# Patient Record
Sex: Female | Born: 1970 | Race: White | Hispanic: No | Marital: Married | Smoking: Never smoker
Health system: Southern US, Community
[De-identification: ages and names within clinical notes are randomized; demographics above are authoritative.]

## PROBLEM LIST (undated history)

## (undated) DIAGNOSIS — R112 Nausea with vomiting, unspecified: Secondary | ICD-10-CM

## (undated) DIAGNOSIS — J069 Acute upper respiratory infection, unspecified: Secondary | ICD-10-CM

## (undated) DIAGNOSIS — K219 Gastro-esophageal reflux disease without esophagitis: Secondary | ICD-10-CM

## (undated) DIAGNOSIS — E876 Hypokalemia: Secondary | ICD-10-CM

## (undated) DIAGNOSIS — I471 Supraventricular tachycardia, unspecified: Secondary | ICD-10-CM

## (undated) DIAGNOSIS — J42 Unspecified chronic bronchitis: Secondary | ICD-10-CM

## (undated) DIAGNOSIS — Z9889 Other specified postprocedural states: Secondary | ICD-10-CM

## (undated) DIAGNOSIS — Z8489 Family history of other specified conditions: Secondary | ICD-10-CM

## (undated) DIAGNOSIS — K589 Irritable bowel syndrome without diarrhea: Secondary | ICD-10-CM

## (undated) DIAGNOSIS — H8109 Meniere's disease, unspecified ear: Secondary | ICD-10-CM

## (undated) DIAGNOSIS — J45909 Unspecified asthma, uncomplicated: Secondary | ICD-10-CM

## (undated) DIAGNOSIS — J329 Chronic sinusitis, unspecified: Secondary | ICD-10-CM

## (undated) HISTORY — PX: NASAL SEPTUM SURGERY: SHX37

## (undated) HISTORY — PX: CARPAL TUNNEL RELEASE: SHX101

## (undated) HISTORY — PX: CHOLECYSTECTOMY: SHX55

## (undated) HISTORY — DX: Unspecified asthma, uncomplicated: J45.909

## (undated) HISTORY — PX: SINOSCOPY: SHX187

## (undated) HISTORY — DX: Acute upper respiratory infection, unspecified: J06.9

## (undated) HISTORY — DX: Gastro-esophageal reflux disease without esophagitis: K21.9

## (undated) HISTORY — PX: LAPAROSCOPIC CHOLECYSTECTOMY: SUR755

## (undated) HISTORY — PX: PLANTAR FASCIA RELEASE: SHX2239

## (undated) HISTORY — DX: Irritable bowel syndrome, unspecified: K58.9

## (undated) HISTORY — PX: ENDOMETRIAL ABLATION: SHX621

---

## 1996-12-25 HISTORY — PX: DILATION AND CURETTAGE OF UTERUS: SHX78

## 2000-10-11 ENCOUNTER — Encounter: Admission: RE | Admit: 2000-10-11 | Discharge: 2000-10-11 | Payer: Self-pay | Admitting: Gastroenterology

## 2000-10-11 ENCOUNTER — Encounter: Payer: Self-pay | Admitting: Gastroenterology

## 2005-10-27 ENCOUNTER — Ambulatory Visit (HOSPITAL_COMMUNITY): Admission: RE | Admit: 2005-10-27 | Discharge: 2005-10-27 | Payer: Self-pay | Admitting: Otolaryngology

## 2005-10-27 ENCOUNTER — Encounter (INDEPENDENT_AMBULATORY_CARE_PROVIDER_SITE_OTHER): Payer: Self-pay | Admitting: Specialist

## 2005-10-27 ENCOUNTER — Ambulatory Visit (HOSPITAL_BASED_OUTPATIENT_CLINIC_OR_DEPARTMENT_OTHER): Admission: RE | Admit: 2005-10-27 | Discharge: 2005-10-27 | Payer: Self-pay | Admitting: Otolaryngology

## 2009-11-12 ENCOUNTER — Inpatient Hospital Stay (HOSPITAL_COMMUNITY): Admission: EM | Admit: 2009-11-12 | Discharge: 2009-11-12 | Payer: Self-pay | Admitting: Emergency Medicine

## 2011-03-29 LAB — CBC
HCT: 40.3 % (ref 36.0–46.0)
Hemoglobin: 14.1 g/dL (ref 12.0–15.0)
MCHC: 34.9 g/dL (ref 30.0–36.0)
MCHC: 35 g/dL (ref 30.0–36.0)
MCV: 97.3 fL (ref 78.0–100.0)
MCV: 97.4 fL (ref 78.0–100.0)
Platelets: 197 10*3/uL (ref 150–400)
RBC: 3.65 MIL/uL — ABNORMAL LOW (ref 3.87–5.11)
RBC: 4.14 MIL/uL (ref 3.87–5.11)
RDW: 12.1 % (ref 11.5–15.5)
RDW: 12.5 % (ref 11.5–15.5)
WBC: 9.7 10*3/uL (ref 4.0–10.5)

## 2011-03-29 LAB — DIFFERENTIAL
Basophils Relative: 0 % (ref 0–1)
Lymphocytes Relative: 10 % — ABNORMAL LOW (ref 12–46)
Lymphs Abs: 1 10*3/uL (ref 0.7–4.0)
Monocytes Absolute: 0.7 10*3/uL (ref 0.1–1.0)
Monocytes Relative: 7 % (ref 3–12)
Neutro Abs: 7.9 10*3/uL — ABNORMAL HIGH (ref 1.7–7.7)
Neutrophils Relative %: 82 % — ABNORMAL HIGH (ref 43–77)

## 2011-03-29 LAB — COMPREHENSIVE METABOLIC PANEL
AST: 73 U/L — ABNORMAL HIGH (ref 0–37)
Albumin: 4 g/dL (ref 3.5–5.2)
Alkaline Phosphatase: 45 U/L (ref 39–117)
BUN: 13 mg/dL (ref 6–23)
CO2: 21 mEq/L (ref 19–32)
Calcium: 7.9 mg/dL — ABNORMAL LOW (ref 8.4–10.5)
Calcium: 8.9 mg/dL (ref 8.4–10.5)
Creatinine, Ser: 0.77 mg/dL (ref 0.4–1.2)
GFR calc Af Amer: >60 mL/min (ref >60)
GFR calc non Af Amer: >60 mL/min (ref >60)
Glucose, Bld: 87 mg/dL (ref 70–99)
Potassium: 2.7 mEq/L — CL (ref 3.5–5.1)
Sodium: 138 mEq/L (ref 135–145)
Total Protein: 5.8 g/dL — ABNORMAL LOW (ref 6.0–8.3)
Total Protein: 7.2 g/dL (ref 6.0–8.3)

## 2011-03-29 LAB — URINALYSIS, ROUTINE W REFLEX MICROSCOPIC
Glucose, UA: NEGATIVE mg/dL
Hgb urine dipstick: NEGATIVE
Protein, ur: NEGATIVE mg/dL
Specific Gravity, Urine: 1.022 (ref 1.005–1.030)
pH: 7.5 (ref 5.0–8.0)

## 2011-03-29 LAB — MAGNESIUM: Magnesium: 2.2 mg/dL (ref 1.5–2.5)

## 2011-03-29 LAB — POCT PREGNANCY, URINE: Preg Test, Ur: NEGATIVE

## 2011-03-29 LAB — POCT CARDIAC MARKERS
CKMB, poc: 1.3 ng/mL (ref 1.0–8.0)
Myoglobin, poc: 137 ng/mL (ref 12–200)
Troponin i, poc: <0.05 ng/mL (ref 0.00–0.09)

## 2011-05-12 NOTE — H&P (Signed)
NAMEMARGUETTA, WINDISH NO.:  [id number]   MEDICAL RECORD NO.:  [id number]          PATIENT TYPE:  AMB   LOCATION:  DSC                          FACILITY:  [facility]   PHYSICIAN:  Hermelinda Medicus, M.D.   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  She has been on multiple antibiotics.  More recently was on Z-Pak 500 mg  daily and has a considerable septal deviation and quite a narrow nose.  She  has a columnella deviation to her left and then an ethmoid septal deviation  to the right, as well as a vomerine septal deviation.  She has also  considerable sinus pressure and headaches on a weekly basis.  She now enters  for a septal reconstruction and a turbinate reduction.   ALLERGIES:  She is allergic to CODEINE AND FLOXIN causing nausea and  vomiting.   MEDICATIONS:  1.  Z-Pak 500 mg daily.  2.  Chlorthalidone or Hygroton 25 mg q.o.d.  3.  Micro-K 10 mEq q.o.d.  4.  Allegra-D 12, 1/2 tab daily.   SOCIAL HISTORY:  She does not drink or smoke.   PAST SURGICAL HISTORY:  1.  Gallbladder.  2.  Foot fracture, right.  3.  Wisdom teeth.   REVIEW OF SYSTEMS:  She has never had any cardiovascular, asthma or  neurological problems.   PAST MEDICAL HISTORY:  She has had the Meniere's problem where she just uses  the diuretic and has done very well.  She has quite an excellent hearing  result.   PHYSICAL EXAMINATION:  VITAL SIGNS:  Blood pressure 121/73, pulse 103,  respirations 22.  She weighs 186 pounds and is 5 feet 4 inches.  HEENT:  Ears are clear.  Tympanic membranes are clear.  The nose shows a  severe  septal deviation, columella to the left, ethmoid to the right, with  a vomerine spur to the right sticking into the inferior  turbinate.  Turbinate hypertrophy.  The oral cavity is clear.  NECK:  Is free of any thyromegaly, cervical adenopathy or mass.  CHEST:  No rales, rhonchi or wheezes.  CARDIOVASCULAR:  No opening snaps, murmurs or gallops.  ABDOMEN:  Unremarkable.  EXTREMITIES:  Above-mentioned foot fracture, right.   INITIAL DIAGNOSES:  1.  Septal deviation.  2.  Sinusitis.  3.  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  4.  History of right foot fracture.  5.  History of gallbladder surgery.  6.  History of wisdom tooth surgery.           ______________________________  Hermelinda Medicus, M.D.     JC/MEDQ  D:  10/27/2005  T:  10/27/2005  Job:  [id number]   cc:   Dr. Windy Canny [place], [place]

## 2011-05-12 NOTE — Op Note (Signed)
[name], Michelle Miles              ACCOUNT NO.:  [id number]   MEDICAL RECORD NO.:  [id number]          PATIENT TYPE:  AMB   LOCATION:  DSC                          FACILITY:  [facility]   PHYSICIAN:  Hermelinda Medicus, M.D.   DATE OF BIRTH:  07-01-1971   DATE OF PROCEDURE:  10/27/2005  DATE OF DISCHARGE:                                 OPERATIVE REPORT   PREOPERATIVE DIAGNOSES:  1.  Septal deviation.  2.  Turbinate hypertrophy with history of sinusitis.  3.  Nasal obstruction.   POSTOPERATIVE DIAGNOSES:  1.  Septal deviation.  2.  Turbinate hypertrophy with history of sinusitis.  3.  Nasal obstruction.   OPERATION:  Septal reconstruction and turbinate reduction.   SURGEON:  Hermelinda Medicus, M.D.   ANESTHESIA:  Local MAC.   DESCRIPTION OF PROCEDURE:  Patient placed in supine position and under local  anesthesia, 1% Xylocaine with epinephrine and topical cocaine 200 mg, we  first approached the columella portion of the septum.  We made a  hemitransfixion incision on her right side and carried around the columella  to the left and it was severely deviated and thickened and somewhat  telescoped, so we trimmed some of that columella septum.  We also mobilized  it off the premaxillary crest to bring it back to the midline. We worked our  way back further to bring that columella back to its normal position. Once  this was mobilized and we then continued further back and took a strip of  posterior quadrilateral cartilage from the septum and then worked for the  ethmoid which was deviated over to the right eye using the open close [employer]. Once this was achieved, we then worked toward the vomerine which  had a severe septal spur and we used a 4 mm chisel and the Takahashi forceps  to remove this bony spur.  We established a septum on the midline and then  it maintained its position well. Then we began our closure using 5-0 plain  catgut and a through-and-through septal suture using  4-0 plain x2. This  stabilized the columella also in the midline. Once this was achieved, we  reduced the inferior turbinates and then used the Elmed at 8.5 amps using  the bipolar and  cauterized inferiorly and laterally on the inferior turbinate. Once this was  achieved, we then placed Telfa within the nose. The patient tolerated  procedure well and is doing well postoperatively and will see her in the  office for removal of this packing today, this afternoon.  Then her follow-  up will be in one week, three weeks and six weeks.           ______________________________  Hermelinda Medicus, M.D.     JC/MEDQ  D:  10/27/2005  T:  10/27/2005  Job:  [id number]   cc:   Albertina Senegal  Fax: [phone]

## 2013-02-01 ENCOUNTER — Encounter (HOSPITAL_COMMUNITY): Payer: Self-pay | Admitting: *Deleted

## 2013-02-01 ENCOUNTER — Emergency Department (HOSPITAL_COMMUNITY): Payer: BC Managed Care – PPO

## 2013-02-01 ENCOUNTER — Inpatient Hospital Stay (HOSPITAL_COMMUNITY)
Admission: EM | Admit: 2013-02-01 | Discharge: 2013-02-03 | DRG: 189 | Disposition: A | Discharge status: Home / Self Care | Payer: BC Managed Care – PPO | Attending: Internal Medicine | Admitting: Internal Medicine

## 2013-02-01 DIAGNOSIS — J329 Chronic sinusitis, unspecified: Secondary | ICD-10-CM | POA: Diagnosis present

## 2013-02-01 DIAGNOSIS — Z79899 Other long term (current) drug therapy: Secondary | ICD-10-CM

## 2013-02-01 DIAGNOSIS — I959 Hypotension, unspecified: Secondary | ICD-10-CM | POA: Diagnosis present

## 2013-02-01 DIAGNOSIS — Z791 Long term (current) use of non-steroidal anti-inflammatories (NSAID): Secondary | ICD-10-CM

## 2013-02-01 DIAGNOSIS — K648 Other hemorrhoids: Principal | ICD-10-CM | POA: Diagnosis present

## 2013-02-01 DIAGNOSIS — K625 Hemorrhage of anus and rectum: Secondary | ICD-10-CM | POA: Diagnosis present

## 2013-02-01 DIAGNOSIS — Z792 Long term (current) use of antibiotics: Secondary | ICD-10-CM

## 2013-02-01 DIAGNOSIS — K589 Irritable bowel syndrome without diarrhea: Secondary | ICD-10-CM | POA: Diagnosis present

## 2013-02-01 DIAGNOSIS — Z885 Allergy status to narcotic agent status: Secondary | ICD-10-CM

## 2013-02-01 DIAGNOSIS — K922 Gastrointestinal hemorrhage, unspecified: Secondary | ICD-10-CM

## 2013-02-01 DIAGNOSIS — I10 Essential (primary) hypertension: Secondary | ICD-10-CM | POA: Diagnosis present

## 2013-02-01 DIAGNOSIS — Z888 Allergy status to other drugs, medicaments and biological substances status: Secondary | ICD-10-CM

## 2013-02-01 HISTORY — DX: Hypokalemia: E87.6

## 2013-02-01 HISTORY — DX: Nausea with vomiting, unspecified: R11.2

## 2013-02-01 HISTORY — DX: Other specified postprocedural states: Z98.890

## 2013-02-01 LAB — CBC
HCT: 42 % (ref 36.0–46.0)
Hemoglobin: 14.6 g/dL (ref 12.0–15.0)
MCH: 32.8 pg (ref 26.0–34.0)
RBC: 4.45 MIL/uL (ref 3.87–5.11)

## 2013-02-01 LAB — COMPREHENSIVE METABOLIC PANEL
ALT: 20 U/L (ref 0–35)
Alkaline Phosphatase: 55 U/L (ref 39–117)
BUN: 13 mg/dL (ref 6–23)
CO2: 28 mEq/L (ref 19–32)
GFR calc Af Amer: >90 mL/min (ref >90)
GFR calc non Af Amer: >90 mL/min (ref >90)
Glucose, Bld: 82 mg/dL (ref 70–99)
Potassium: 3.5 mEq/L (ref 3.5–5.1)
Sodium: 137 mEq/L (ref 135–145)
Total Bilirubin: 0.2 mg/dL — ABNORMAL LOW (ref 0.3–1.2)

## 2013-02-01 LAB — TYPE AND SCREEN

## 2013-02-01 LAB — APTT: aPTT: 27 seconds (ref 24–37)

## 2013-02-01 LAB — PROTIME-INR
INR: 0.9 (ref 0.00–1.49)
Prothrombin Time: 12.1 seconds (ref 11.6–15.2)

## 2013-02-01 MED ORDER — ONDANSETRON HCL 4 MG PO TABS: 4.0000 mg | ORAL_TABLET | Freq: Four times a day (QID) | ORAL | Status: DC | PRN

## 2013-02-01 MED ORDER — SODIUM CHLORIDE 0.9 % IV SOLN: INTRAVENOUS | Status: AC

## 2013-02-01 MED ORDER — SODIUM CHLORIDE 0.9 % IV SOLN
INTRAVENOUS | Status: DC
  Administered 2013-02-01 – 2013-02-02 (×4): via INTRAVENOUS

## 2013-02-01 MED ORDER — SODIUM CHLORIDE 0.9 % IV BOLUS (SEPSIS): 1000.0000 mL | Freq: Once | INTRAVENOUS | Status: DC

## 2013-02-01 MED ORDER — PANTOPRAZOLE SODIUM 40 MG IV SOLR
40.0000 mg | INTRAVENOUS | Status: DC
  Administered 2013-02-01 – 2013-02-02 (×2): 40 mg via INTRAVENOUS
  Filled 2013-02-01 (×3): qty 40

## 2013-02-01 MED ORDER — IOHEXOL 300 MG/ML  SOLN
100.0000 mL | Freq: Once | INTRAMUSCULAR | Status: AC | PRN
  Administered 2013-02-01: 100 mL via INTRAVENOUS

## 2013-02-01 MED ORDER — ONDANSETRON HCL 4 MG/2ML IJ SOLN
4.0000 mg | Freq: Four times a day (QID) | INTRAMUSCULAR | Status: DC | PRN
  Administered 2013-02-01 – 2013-02-02 (×2): 4 mg via INTRAVENOUS
  Filled 2013-02-01 (×2): qty 2

## 2013-02-01 MED ORDER — SODIUM CHLORIDE 0.9 % IJ SOLN
3.0000 mL | Freq: Two times a day (BID) | INTRAMUSCULAR | Status: DC
  Administered 2013-02-01 – 2013-02-02 (×2): 3 mL via INTRAVENOUS

## 2013-02-01 MED ORDER — IOHEXOL 300 MG/ML  SOLN
50.0000 mL | Freq: Once | INTRAMUSCULAR | Status: AC | PRN
  Administered 2013-02-01: 50 mL via ORAL

## 2013-02-01 MED ORDER — LORATADINE 10 MG PO TABS
10.0000 mg | ORAL_TABLET | Freq: Every day | ORAL | Status: DC
  Administered 2013-02-02: 10 mg via ORAL
  Filled 2013-02-01 (×2): qty 1

## 2013-02-01 NOTE — ED Notes (Signed)
Pt finished drinking contrast, ct called.

## 2013-02-01 NOTE — ED Notes (Signed)
Pt reports having small amounts of rectal bleeding since last week, was having constipation. Started having large amounts of bright red rectal bleeding with clots, starting Thursday. Denies any pain.

## 2013-02-01 NOTE — Progress Notes (Signed)
  Pt admitted to the unit. Pt is stable, alert and oriented per baseline. Oriented to room, staff, and call bell. Educated to call for any assistance. Bed in lowest position, call bell within reach- will continue to monitor. 

## 2013-02-01 NOTE — ED Provider Notes (Addendum)
History     CSN: [id number]  Arrival date & time 02/01/13  1232   First MD Initiated Contact with Patient 02/01/13 1413      Chief Complaint  Patient presents with  . Rectal Bleeding    (Consider location/radiation/quality/duration/timing/severity/associated sxs/prior treatment) The history is provided by the patient.  pt c/o brbpr x 1 week without associated abdominal pain--no syncope or near syncope, some weakness--no prior h/o same--no treatment used pta, nothing makes sx better or worse  Past Medical History  Diagnosis Date  . Hypokalemia   . Sinus infection     History reviewed. No pertinent past surgical history.  History reviewed. No pertinent family history.  History  Substance Use Topics  . Smoking status: Not on file  . Smokeless tobacco: Not on file  . Alcohol Use: No    OB History   Grav Para Term Preterm Abortions TAB SAB Ect Mult Living                  Review of Systems  All other systems reviewed and are negative.    Allergies  Codeine; Floxin; and Prednisone  Home Medications   Current Outpatient Rx  Name  Route  Sig  Dispense  Refill  . acetaminophen (TYLENOL) 500 MG tablet   Oral   Take 1,000 mg by mouth every 6 (six) hours as needed for pain. For pain         . amoxicillin-clavulanate (AUGMENTIN XR) 1000-62.5 MG per tablet   Oral   Take 2 tablets by mouth 2 (two) times daily.         . cetirizine (ZYRTEC) 10 MG tablet   Oral   Take 10 mg by mouth daily.         Marland Kitchen ibuprofen (ADVIL,MOTRIN) 200 MG tablet   Oral   Take 400-600 mg by mouth every 6 (six) hours as needed for pain. For pain         . Multiple Vitamins-Minerals (MULTIVITAMIN PO)   Oral   Take 1 tablet by mouth daily.         . polyethylene glycol (MIRALAX / GLYCOLAX) packet   Oral   Take 17 g by mouth daily.         . potassium chloride SA (K-DUR,KLOR-CON) 20 MEQ tablet   Oral   Take 20 mEq by mouth 2 (two) times daily.         . Probiotic  Product (PROBIOTIC DAILY PO)   Oral   Take 1 tablet by mouth daily.         Marland Kitchen triamterene-hydrochlorothiazide (MAXZIDE-25) 37.5-25 MG per tablet   Oral   Take 1 tablet by mouth daily.           BP 110/55  Pulse 91  Temp(Src) 97.2 F (36.2 C) (Oral)  Resp 20  SpO2 100%  Physical Exam  Nursing note and vitals reviewed. Constitutional: She is oriented to person, place, and time. She appears well-developed and well-nourished.  Non-toxic appearance. No distress.  HENT:  Head: Normocephalic and atraumatic.  Eyes: Conjunctivae, EOM and lids are normal. Pupils are equal, round, and reactive to light.  Neck: Normal range of motion. Neck supple. No tracheal deviation present. No mass present.  Cardiovascular: Normal rate, regular rhythm and normal heart sounds.  Exam reveals no gallop.   No murmur heard. Pulmonary/Chest: Effort normal and breath sounds normal. No stridor. No respiratory distress. She has no decreased breath sounds. She has no wheezes. She has no  rhonchi. She has no rales.  Abdominal: Soft. Normal appearance and bowel sounds are normal. She exhibits no distension. There is no tenderness. There is no rebound and no CVA tenderness.  Musculoskeletal: Normal range of motion. She exhibits no edema and no tenderness.  Neurological: She is alert and oriented to person, place, and time. She has normal strength. No cranial nerve deficit or sensory deficit. GCS eye subscore is 4. GCS verbal subscore is 5. GCS motor subscore is 6.  Skin: Skin is warm and dry. No abrasion and no rash noted.  Psychiatric: She has a normal mood and affect. Her speech is normal and behavior is normal.    ED Course  Procedures (including critical care time)  Labs Reviewed  STOOL CULTURE  CLOSTRIDIUM DIFFICILE BY PCR  CLOSTRIDIUM DIFFICILE BY PCR  CBC  COMPREHENSIVE METABOLIC PANEL  PROTIME-INR  APTT  TYPE AND SCREEN   No results found.   No diagnosis found.    MDM  Stool cultures  ordered. Suspect the patient might have diverticulosis. She also may have C. difficile colitis do to recent antibiotic use. Abdominal CT ordered and is pending        Toy Baker, MD 02/01/13 1435  Toy Baker, MD 02/13/13 [id number]

## 2013-02-01 NOTE — H&P (Addendum)
[facility] History and Physical  Michelle Miles ZOX:[id number] DOB: 1971-08-19 DOA: 02/01/2013  Referring physician: Dr Zachary George PCP: Dr Leonor Liv at [place]   Chief Complaint: BRBPR.since yesterday  HPI: Michelle Miles is a 42 y.o. female with h/o IBS, hypertension, a recent sinus infection, came in for BRBPR since yesterday, about 5 to 7 episodes of painless bleeding from the rectum, not associated with pain, nausea or vomiting. On arrival to ED she was found to have normal H&H, but slightly hypotensive, she is currently asymptomatic from the borderline BP. A CT ABD and pelvis was done and was unremarkable. PR exam shows frank blood She also reports that she used ibuprofen 200 mg tab about 6 a day for a week. She is being admitted to hospitalist service for evaluation of GI BLEED.    Review of Systems: The patient denies anorexia, fever, weight loss,, vision loss, decreased hearing, hoarseness, chest pain, syncope, dyspnea on exertion, peripheral edema, balance deficits, hemoptysis, abdominal pain, , severe indigestion/heartburn, hematuria, incontinence, genital sores, muscle weakness, suspicious skin lesions, transient blindness, difficulty walking, depression, unusual weight change, enlarged lymph nodes, angioedema, and breast masses.    Past Medical History  Diagnosis Date  . Hypokalemia   . Sinus infection    History reviewed. No pertinent past surgical history. Social History:  reports that she does not drink alcohol or use illicit drugs. Her tobacco history is not on file.  where does patient live--home,  Allergies  Allergen Reactions  . Codeine Nausea And Vomiting  . Floxin (Ofloxacin)     Causes hallucinations  . Prednisone Rash    History reviewed. No pertinent family history. NKFAMILY HISTORY.  Prior to Admission medications   Medication Sig Start Date End Date Taking? Authorizing Provider  acetaminophen (TYLENOL) 500 MG tablet Take 1,000 mg by mouth every 6  (six) hours as needed for pain. For pain   Yes Historical Provider, MD  amoxicillin-clavulanate (AUGMENTIN XR) 1000-62.5 MG per tablet Take 2 tablets by mouth 2 (two) times daily.   Yes Historical Provider, MD  cetirizine (ZYRTEC) 10 MG tablet Take 10 mg by mouth daily.   Yes Historical Provider, MD  ibuprofen (ADVIL,MOTRIN) 200 MG tablet Take 400-600 mg by mouth every 6 (six) hours as needed for pain. For pain   Yes Historical Provider, MD  Multiple Vitamins-Minerals (MULTIVITAMIN PO) Take 1 tablet by mouth daily.   Yes Historical Provider, MD  polyethylene glycol (MIRALAX / GLYCOLAX) packet Take 17 g by mouth daily.   Yes Historical Provider, MD  potassium chloride SA (K-DUR,KLOR-CON) 20 MEQ tablet Take 20 mEq by mouth 2 (two) times daily.   Yes Historical Provider, MD  Probiotic Product (PROBIOTIC DAILY PO) Take 1 tablet by mouth daily.   Yes Historical Provider, MD  triamterene-hydrochlorothiazide (MAXZIDE-25) 37.5-25 MG per tablet Take 1 tablet by mouth daily.   Yes Historical Provider, MD   Physical Exam: Filed Vitals:   02/01/13 1715 02/01/13 1730 02/01/13 1800 02/01/13 1815  BP: 91/71 73/56 99/55  89/63  Pulse: 85 86 82 84  Temp:      TempSrc:      Resp: 24 18 16 11   SpO2: 97% 100% 100% 99%    Constitutional: Vital signs reviewed.  Patient is a well-developed and well-nourished  in no acute distress and cooperative with exam. Alert and oriented x3.  Head: Normocephalic and atraumatic Mouth: no erythema or exudates, MMM Eyes: PERRL, EOMI, conjunctivae normal, No scleral icterus.  Neck: Supple, Trachea midline normal ROM, No JVD,  mass, thyromegaly, or carotid bruit present.  Cardiovascular: RRR, S1 normal, S2 normal, no MRG, pulses symmetric and intact bilaterally Pulmonary/Chest: CTAB, no wheezes, rales, or rhonchi Abdominal: Soft. Non-tender, non-distended, bowel sounds are normal, no masses, organomegaly, or guarding present.  Musculoskeletal: No joint deformities, erythema, or  stiffness, ROM full and no nontender Neurological: A&O x3, Strength is normal and symmetric bilaterally, cranial nerve II-XII are grossly intact, no focal motor deficit, sensory intact to light touch bilaterally.  Skin: Warm, dry and intact. No rash, cyanosis, or clubbing.  Psychiatric: Normal mood and affect. speech and behavior is normal.   Labs on Admission:  Basic Metabolic Panel:  Recent Labs Lab 02/01/13 1430  NA 137  K 3.5  CL 101  CO2 28  GLUCOSE 82  BUN 13  CREATININE 0.70  CALCIUM 9.3   Liver Function Tests:  Recent Labs Lab 02/01/13 1430  AST 29  ALT 20  ALKPHOS 55  BILITOT 0.2*  PROT 7.8  ALBUMIN 3.9   No results found for this basename: LIPASE, AMYLASE,  in the last 168 hours No results found for this basename: AMMONIA,  in the last 168 hours CBC:  Recent Labs Lab 02/01/13 1430  WBC 6.4  HGB 14.6  HCT 42.0  MCV 94.4  PLT 310   Cardiac Enzymes: No results found for this basename: CKTOTAL, CKMB, CKMBINDEX, TROPONINI,  in the last 168 hours  BNP (last 3 results) No results found for this basename: PROBNP,  in the last 8760 hours CBG: No results found for this basename: GLUCAP,  in the last 168 hours  Radiological Exams on Admission: Ct Abdomen Pelvis W Contrast  02/01/2013  *RADIOLOGY REPORT*  Clinical Data: Abdominal and rectal pain with bleeding  CT ABDOMEN AND PELVIS WITH CONTRAST  Technique:  Multidetector CT imaging of the abdomen and pelvis was performed following the standard protocol during bolus administration of intravenous contrast.  Contrast: OMNIPAQUE IOHEXOL 300 MG/ML  SOLN, 50mL OMNIPAQUE IOHEXOL 300 MG/ML  SOLN  Comparison: CT abdomen pelvis of 11/12/2009  Findings: The lung bases are clear.  The liver enhances with no focal abnormality and no ductal dilatation is seen. Surgical clips are present of prior cholecystectomy.  The the pancreas is normal in size and the pancreatic duct is not dilated.  The adrenal glands and spleen are  unremarkable.  The stomach is not well distended. The kidneys enhance with no calculus or mass and no hydronephrosis is seen.  On delayed images the pelvocaliceal systems are unremarkable.  The abdominal aorta is normal in caliber.  No adenopathy is seen.  The uterus appears anteverted and somewhat inhomogeneous and enhancement.  The urinary bladder is unremarkable.  No free fluid is seen within the pelvis.  No edema of the colonic mucosa is seen. The terminal ileum is fluid filled and unremarkable.  The appendix is moderately seen in the right pelvis with no abnormality noted. No skeletal abnormality is seen.  IMPRESSION:  1.  No evidence of colonic mucosal edema is seen. 2.  The appendix and terminal ileum are unremarkable.   Original Report Authenticated By: Dwyane Dee, M.D.     EKG: pending.   Assessment/Plan 1. BRBPR;  Admit to telemetry. - H&H Q8hrs, transfuse if H&H drops to less than 7. - clear liquid diet - IV protonix -  Stop NSAIDS. - gi consult from Dr Madilyn Fireman requested.   2. Hypotension: improved with IV FLUIDS. She is currently asymptomatic. Her antihypertensives are being held.   3.  Diarrhea: with recent use of antibiotics, stool culture and c diff pcr ordered to evaluate for C DIFF infection.   4. DVT prophylaxis: SCD'S    Code Status: full code Family Communication: family at beds ide Disposition Plan: 2 days  Time spent: 30 min  Yuvan Medinger [facility] Pager [phone]  If 7PM-7AM, please contact night-coverage [email or web address] Password [facility] 02/01/2013, 6:43 PM

## 2013-02-01 NOTE — Consult Note (Signed)
[facility] Consult Note  Referring Provider: No ref. provider found Primary Care Physician:  No primary provider on file. Primary Gastroenterologist:  Dr.  Antony Contras Complaint: Rectal bleeding HPI: Michelle Miles is an 42 y.o. white female  who presents with recurrent rectal bleeding painless described as mostly bright red blood with some clots in diminishing stool over the last 2 days. A week ago she noted a small amount of bright red blood after a bowel movement when she was s yesterday. After having several similar bowel movements through the night she came to the emergency room. She denies any abdominal pain weakness tenesmus or diarrhea beyond the bleeding. She was started on antibiotics for a sinus infection. This  may have caused a little constipation but  she did not notice any more bleeding until 2 days ago . He is had 2 colonoscopies in the past she thinks the last of which was 5 years ago. These were done for evaluation of what turned out to be irritable bowel syndrome and she has no previous history of rectal bleeding. She has no known history of hemorrhoids and no perianal pain. CT scan of the abdomen and emergency room was normal and digital rectal exam did reveal bright red blood on the examining glove.   Past Medical History  Diagnosis Date  . Hypokalemia   . Sinus infection     History reviewed. No pertinent past surgical history.  Medications Prior to Admission  Medication Sig Dispense Refill  . acetaminophen (TYLENOL) 500 MG tablet Take 1,000 mg by mouth every 6 (six) hours as needed for pain. For pain      . amoxicillin-clavulanate (AUGMENTIN XR) 1000-62.5 MG per tablet Take 2 tablets by mouth 2 (two) times daily.      . cetirizine (ZYRTEC) 10 MG tablet Take 10 mg by mouth daily.      Marland Kitchen ibuprofen (ADVIL,MOTRIN) 200 MG tablet Take 400-600 mg by mouth every 6 (six) hours as needed for pain. For pain      . Multiple Vitamins-Minerals (MULTIVITAMIN PO) Take 1  tablet by mouth daily.      . polyethylene glycol (MIRALAX / GLYCOLAX) packet Take 17 g by mouth daily.      . potassium chloride SA (K-DUR,KLOR-CON) 20 MEQ tablet Take 20 mEq by mouth 2 (two) times daily.      . Probiotic Product (PROBIOTIC DAILY PO) Take 1 tablet by mouth daily.      Marland Kitchen triamterene-hydrochlorothiazide (MAXZIDE-25) 37.5-25 MG per tablet Take 1 tablet by mouth daily.        Allergies:  Allergies  Allergen Reactions  . Codeine Nausea And Vomiting  . Floxin (Ofloxacin)     Causes hallucinations  . Prednisone Rash    History reviewed. No pertinent family history.  Social History:  reports that she does not drink alcohol or use illicit drugs. Her tobacco history is not on file.  Review of Systems: negative except as above    Blood pressure 105/65, pulse 76, temperature 98.1 F (36.7 C), temperature source Oral, resp. rate 15, height 5' 4.8" (1.646 m), weight 78.79 kg (173 lb 11.2 oz), SpO2 100.00%. Head: Normocephalic, without obvious abnormality, atraumatic Neck: no adenopathy, no carotid bruit, no JVD, supple, symmetrical, trachea midline and thyroid not enlarged, symmetric, no tenderness/mass/nodules Resp: clear to auscultation bilaterally Cardio: regular rate and rhythm, S1, S2 normal, no murmur, click, rub or gallop GI:  abdomen soft nondistended with normoactive bowel sounds. No hepatosplenomegaly mass or guarding.  Extremities: extremities  normal, atraumatic, no cyanosis or edema  Results for orders placed during the hospital encounter of 02/01/13 (from the past 48 hour(s))  TYPE AND SCREEN     Status: None   Collection Time    02/01/13  2:15 PM      Result Value Range   ABO/RH(D) A NEG     Antibody Screen NEG     Sample Expiration 02/04/2013    ABO/RH     Status: None   Collection Time    02/01/13  2:15 PM      Result Value Range   ABO/RH(D) A NEG    CBC     Status: None   Collection Time    02/01/13  2:30 PM      Result Value Range   WBC 6.4  4.0  - 10.5 K/uL   RBC 4.45  3.87 - 5.11 MIL/uL   Hemoglobin 14.6  12.0 - 15.0 g/dL   HCT 16.1  09.6 - 04.5 %   MCV 94.4  78.0 - 100.0 fL   MCH 32.8  26.0 - 34.0 pg   MCHC 34.8  30.0 - 36.0 g/dL   RDW 40.9  81.1 - 91.4 %   Platelets 310  150 - 400 K/uL  COMPREHENSIVE METABOLIC PANEL     Status: Abnormal   Collection Time    02/01/13  2:30 PM      Result Value Range   Sodium 137  135 - 145 mEq/L   Potassium 3.5  3.5 - 5.1 mEq/L   Chloride 101  96 - 112 mEq/L   CO2 28  19 - 32 mEq/L   Glucose, Bld 82  70 - 99 mg/dL   BUN 13  6 - 23 mg/dL   Creatinine, Ser 7.82  0.50 - 1.10 mg/dL   Calcium 9.3  8.4 - 95.6 mg/dL   Total Protein 7.8  6.0 - 8.3 g/dL   Albumin 3.9  3.5 - 5.2 g/dL   AST 29  0 - 37 U/L   ALT 20  0 - 35 U/L   Alkaline Phosphatase 55  39 - 117 U/L   Total Bilirubin 0.2 (*) 0.3 - 1.2 mg/dL   GFR calc non Af Amer >90  >90 mL/min   GFR calc Af Amer >90  >90 mL/min   Comment:            The eGFR has been calculated     using the CKD EPI equation.     This calculation has not been     validated in all clinical     situations.     eGFR's persistently     <90 mL/min signify     possible Chronic Kidney Disease.  PROTIME-INR     Status: None   Collection Time    02/01/13  2:30 PM      Result Value Range   Prothrombin Time 12.1  11.6 - 15.2 seconds   INR 0.90  0.00 - 1.49  APTT     Status: None   Collection Time    02/01/13  2:30 PM      Result Value Range   aPTT 27  24 - 37 seconds  POCT PREGNANCY, URINE     Status: None   Collection Time    02/01/13  4:08 PM      Result Value Range   Preg Test, Ur NEGATIVE  NEGATIVE   Comment:            THE  SENSITIVITY OF THIS     METHODOLOGY IS >24 mIU/mL  HEMOGLOBIN AND HEMATOCRIT, BLOOD     Status: None   Collection Time    02/01/13  7:02 PM      Result Value Range   Hemoglobin 12.5  12.0 - 15.0 g/dL   HCT 16.1  09.6 - 04.5 %   Ct Abdomen Pelvis W Contrast  02/01/2013  *RADIOLOGY REPORT*  Clinical Data: Abdominal and  rectal pain with bleeding  CT ABDOMEN AND PELVIS WITH CONTRAST  Technique:  Multidetector CT imaging of the abdomen and pelvis was performed following the standard protocol during bolus administration of intravenous contrast.  Contrast: OMNIPAQUE IOHEXOL 300 MG/ML  SOLN, 50mL OMNIPAQUE IOHEXOL 300 MG/ML  SOLN  Comparison: CT abdomen pelvis of 11/12/2009  Findings: The lung bases are clear.  The liver enhances with no focal abnormality and no ductal dilatation is seen. Surgical clips are present of prior cholecystectomy.  The the pancreas is normal in size and the pancreatic duct is not dilated.  The adrenal glands and spleen are unremarkable.  The stomach is not well distended. The kidneys enhance with no calculus or mass and no hydronephrosis is seen.  On delayed images the pelvocaliceal systems are unremarkable.  The abdominal aorta is normal in caliber.  No adenopathy is seen.  The uterus appears anteverted and somewhat inhomogeneous and enhancement.  The urinary bladder is unremarkable.  No free fluid is seen within the pelvis.  No edema of the colonic mucosa is seen. The terminal ileum is fluid filled and unremarkable.  The appendix is moderately seen in the right pelvis with no abnormality noted. No skeletal abnormality is seen.  IMPRESSION:  1.  No evidence of colonic mucosal edema is seen. 2.  The appendix and terminal ileum are unremarkable.   Original Report Authenticated By: Dwyane Dee, M.D.     Assessment:  painless rectal bleeding and which sounds more than explained by hemorrhoidal bleeding with no perianal symptoms. Does not sound like colitis and more is there an appearance of colon thickening on CT. Plan:   we'll proceed with colonoscopy in the next day or 2 to rule out lesion such as diverticular bleeding or AVMs or any other bleeding lesion. Maikol Grassia C 02/01/2013, 8:17 PM

## 2013-02-02 LAB — CBC
MCH: 31.8 pg (ref 26.0–34.0)
MCHC: 33.7 g/dL (ref 30.0–36.0)
MCV: 94.5 fL (ref 78.0–100.0)
Platelets: 241 10*3/uL (ref 150–400)
RDW: 12.4 % (ref 11.5–15.5)
WBC: 6.2 10*3/uL (ref 4.0–10.5)

## 2013-02-02 LAB — HEMOGLOBIN AND HEMATOCRIT, BLOOD: Hemoglobin: 13.7 g/dL (ref 12.0–15.0)

## 2013-02-02 MED ORDER — SODIUM CHLORIDE 0.9 % IV SOLN
INTRAVENOUS | Status: DC
  Administered 2013-02-02: 20 mL/h via INTRAVENOUS
  Administered 2013-02-03: 03:00:00 via INTRAVENOUS
  Administered 2013-02-03: 500 mL via INTRAVENOUS

## 2013-02-02 MED ORDER — AMOXICILLIN-POT CLAVULANATE 875-125 MG PO TABS
1.0000 | ORAL_TABLET | Freq: Two times a day (BID) | ORAL | Status: DC
  Administered 2013-02-02 (×2): 1 via ORAL
  Filled 2013-02-02 (×5): qty 1

## 2013-02-02 MED ORDER — PEG 3350-KCL-NA BICARB-NACL 420 G PO SOLR
4000.0000 mL | Freq: Once | ORAL | Status: AC
  Administered 2013-02-02: 4000 mL via ORAL
  Filled 2013-02-02 (×2): qty 4000

## 2013-02-02 MED ORDER — FLUTICASONE PROPIONATE 50 MCG/ACT NA SUSP
2.0000 | Freq: Every day | NASAL | Status: DC
  Administered 2013-02-02: 2 via NASAL
  Filled 2013-02-02: qty 16

## 2013-02-02 NOTE — Progress Notes (Addendum)
The patient continued to have small amounts of bright red blood per rectum through the night without any pain. Hemoglobin is down to 12.7. We'll proceed with colon tomorrow.

## 2013-02-02 NOTE — Progress Notes (Signed)
Pt has blood clots in urine that has come from the rectum. Pt is aware of problem- says it has been going on for a while. Will continue to monitor

## 2013-02-02 NOTE — Progress Notes (Signed)
[facility] PROGRESS NOTE  Assessment/Plan: Active Hospital Problems Rectal bleed - CBC Q8hrs, transfuse if H&H drops to less than 7. small amounts of bright red blood per rectum through the night without any pain HBg drop from 14.6->12.7. - Clear liquid diet  - IV protonix  - Stop NSAID'S. - Colonoscopy 2.10.2014   Hypotension - improved with IV FLUIDS.  - She is currently asymptomatic.  Sinusitis: - flonase, augmentin   Code Status: full code  Family Communication: family at beds ide  Disposition Plan: 2 days    Consultants:  Dr. Madilyn Fireman  Procedures:  Colonoscopy 2.10.2014  Antibiotics:  augmentin  HPI/Subjective: Stuffy nose  Objective: Filed Vitals:   02/01/13 1815 02/01/13 1956 02/02/13 0151 02/02/13 0641  BP: 89/63 105/65 97/64 105/74  Pulse: 84 76 71 72  Temp:  98.1 F (36.7 C) 97.6 F (36.4 C) 97.5 F (36.4 C)  TempSrc:  Oral Oral Oral  Resp: 11 15 15 15   Height:  5' 4.8" (1.646 m)    Weight:  78.79 kg (173 lb 11.2 oz)    SpO2: 99% 100% 99% 99%    Intake/Output Summary (Last 24 hours) at 02/02/13 1036 Last data filed at 02/02/13 0914  Gross per 24 hour  Intake    200 ml  Output    750 ml  Net   -550 ml   Filed Weights   02/01/13 1956  Weight: 78.79 kg (173 lb 11.2 oz)    Exam:  General: Alert, awake, oriented x3, in no acute distress.  HEENT: No bruits, no goiter.  Heart: Regular rate and rhythm, without murmurs, rubs, gallops.  Lungs: Good air movement, clear to auscultation. Abdomen: Soft, nontender, nondistended, positive bowel sounds.  Neuro: Grossly intact, nonfocal.   Data Reviewed: Basic Metabolic Panel:  Recent Labs Lab 02/01/13 1430  NA 137  K 3.5  CL 101  CO2 28  GLUCOSE 82  BUN 13  CREATININE 0.70  CALCIUM 9.3   Liver Function Tests:  Recent Labs Lab 02/01/13 1430  AST 29  ALT 20  ALKPHOS 55  BILITOT 0.2*  PROT 7.8  ALBUMIN 3.9   No results found for this basename: LIPASE, AMYLASE,  in the  last 168 hours No results found for this basename: AMMONIA,  in the last 168 hours CBC:  Recent Labs Lab 02/01/13 1430 02/01/13 1902 02/02/13 0225  WBC 6.4  --  6.2  HGB 14.6 12.5 12.7  HCT 42.0 37.6 37.7  MCV 94.4  --  94.5  PLT 310  --  241   Cardiac Enzymes: No results found for this basename: CKTOTAL, CKMB, CKMBINDEX, TROPONINI,  in the last 168 hours BNP (last 3 results) No results found for this basename: PROBNP,  in the last 8760 hours CBG: No results found for this basename: GLUCAP,  in the last 168 hours  No results found for this or any previous visit (from the past 240 hour(s)).   Studies: Ct Abdomen Pelvis W Contrast  02/01/2013  *RADIOLOGY REPORT*  Clinical Data: Abdominal and rectal pain with bleeding  CT ABDOMEN AND PELVIS WITH CONTRAST  Technique:  Multidetector CT imaging of the abdomen and pelvis was performed following the standard protocol during bolus administration of intravenous contrast.  Contrast: OMNIPAQUE IOHEXOL 300 MG/ML  SOLN, 50mL OMNIPAQUE IOHEXOL 300 MG/ML  SOLN  Comparison: CT abdomen pelvis of 11/12/2009  Findings: The lung bases are clear.  The liver enhances with no focal abnormality and no ductal dilatation is seen. Surgical  clips are present of prior cholecystectomy.  The the pancreas is normal in size and the pancreatic duct is not dilated.  The adrenal glands and spleen are unremarkable.  The stomach is not well distended. The kidneys enhance with no calculus or mass and no hydronephrosis is seen.  On delayed images the pelvocaliceal systems are unremarkable.  The abdominal aorta is normal in caliber.  No adenopathy is seen.  The uterus appears anteverted and somewhat inhomogeneous and enhancement.  The urinary bladder is unremarkable.  No free fluid is seen within the pelvis.  No edema of the colonic mucosa is seen. The terminal ileum is fluid filled and unremarkable.  The appendix is moderately seen in the right pelvis with no abnormality  noted. No skeletal abnormality is seen.  IMPRESSION:  1.  No evidence of colonic mucosal edema is seen. 2.  The appendix and terminal ileum are unremarkable.   Original Report Authenticated By: Dwyane Dee, M.D.     Scheduled Meds: . sodium chloride   Intravenous STAT  . loratadine  10 mg Oral Daily  . pantoprazole (PROTONIX) IV  40 mg Intravenous Q24H  . polyethylene glycol-electrolytes  4,000 mL Oral Once  . sodium chloride  1,000 mL Intravenous Once  . sodium chloride  3 mL Intravenous Q12H   Continuous Infusions: . sodium chloride 125 mL/hr at 02/02/13 0626  . sodium chloride       Marinda Elk  [facility] Pager [phone].  If 8PM-8AM, please contact night-coverage at [email or web address], password [email or web address] 02/02/2013, 10:36 AM  LOS: 1 day

## 2013-02-03 ENCOUNTER — Encounter (HOSPITAL_COMMUNITY)
Admission: EM | Disposition: A | Discharge status: Home / Self Care | Payer: Self-pay | Source: Home / Self Care | Attending: Internal Medicine

## 2013-02-03 ENCOUNTER — Encounter (HOSPITAL_COMMUNITY): Payer: Self-pay | Admitting: Gastroenterology

## 2013-02-03 HISTORY — PX: COLONOSCOPY: SHX5424

## 2013-02-03 LAB — GLUCOSE, CAPILLARY: Glucose-Capillary: 82 mg/dL (ref 70–99)

## 2013-02-03 SURGERY — COLONOSCOPY
Anesthesia: Moderate Sedation

## 2013-02-03 MED ORDER — FENTANYL CITRATE 0.05 MG/ML IJ SOLN
INTRAMUSCULAR | Status: DC | PRN
  Administered 2013-02-03: 25 ug via INTRAVENOUS
  Administered 2013-02-03: 10 ug via INTRAVENOUS
  Administered 2013-02-03 (×2): 20 ug via INTRAVENOUS
  Administered 2013-02-03: 25 ug via INTRAVENOUS

## 2013-02-03 MED ORDER — POTASSIUM CHLORIDE CRYS ER 20 MEQ PO TBCR: 20.0000 meq | EXTENDED_RELEASE_TABLET | Freq: Two times a day (BID) | ORAL | Status: DC

## 2013-02-03 MED ORDER — HYDROCORTISONE ACETATE 25 MG RE SUPP: 25.0000 mg | Freq: Two times a day (BID) | RECTAL | Status: DC

## 2013-02-03 MED ORDER — FENTANYL CITRATE 0.05 MG/ML IJ SOLN
INTRAMUSCULAR | Status: AC
  Filled 2013-02-03: qty 4

## 2013-02-03 MED ORDER — MIDAZOLAM HCL 5 MG/ML IJ SOLN
INTRAMUSCULAR | Status: AC
  Filled 2013-02-03: qty 2

## 2013-02-03 MED ORDER — MIDAZOLAM HCL 5 MG/5ML IJ SOLN
INTRAMUSCULAR | Status: DC | PRN
  Administered 2013-02-03: 2 mg via INTRAVENOUS
  Administered 2013-02-03: 2.5 mg via INTRAVENOUS
  Administered 2013-02-03: 2 mg via INTRAVENOUS
  Administered 2013-02-03: 1 mg via INTRAVENOUS
  Administered 2013-02-03: 2.5 mg via INTRAVENOUS

## 2013-02-03 MED ORDER — FLUTICASONE PROPIONATE 50 MCG/ACT NA SUSP: 2.0000 | Freq: Every day | NASAL | Status: DC

## 2013-02-03 MED ORDER — GLUCOSE 4 G PO CHEW
1.0000 | CHEWABLE_TABLET | Freq: Once | ORAL | Status: DC
  Filled 2013-02-03: qty 1

## 2013-02-03 MED ORDER — ACETAMINOPHEN 325 MG PO TABS: 650.0000 mg | ORAL_TABLET | Freq: Four times a day (QID) | ORAL | Status: DC | PRN

## 2013-02-03 MED ORDER — TRIAMTERENE-HCTZ 37.5-25 MG PO TABS: 1.0000 | ORAL_TABLET | Freq: Every day | ORAL | Status: AC

## 2013-02-03 NOTE — Op Note (Signed)
[facility] [address] [place], [address]   COLONOSCOPY PROCEDURE REPORT  PATIENT: Michelle, Miles  MR#: [id number] BIRTHDATE: 10/22/1971 , 41  yrs. old GENDER: Female ENDOSCOPIST: Dorena Cookey, MD REFERRED BY: PROCEDURE DATE:  02/03/2013 PROCEDURE: ASA CLASS: INDICATIONS:  rectal bleeding MEDICATIONS:    100 mcg fentanyl, 10 mg Versed  DESCRIPTION OF PROCEDURE: the Olympus videocolonoscope was inserted into the rectum and advanced to the cecum, confirmed by transillumination McBurney's point in visualization of ileocecal valve and appendiceal orifice. The prep was excellent. The cecum descending transverse descending sigmoid and rectum all appeared normal no masses polyps diverticula or other mucosal amounts. Within the rectum there were seen some fairly large internal hemorrhoids with prominent vasculature but no active bleeding. Retroflexed view revealed no additional lesions. The scope was then withdrawn and the patient returned to the recovery room in stable condition she tolerated the procedure well there were no immediate complications impression 1     COMPLICATIONS: None  ENDOSCOPIC IMPRESSION:significant internal hemorrhoids otherwise normal study no other obvious cause for bleeding.  RECOMMENDATIONS:symptomatic treatment for hemorrhoids. If this does not control bleeding, referral for possible surgery.    _______________________________ Rosalie DoctorDorena Cookey, MD 02/03/2013 10:50 AM

## 2013-02-03 NOTE — Discharge Summary (Signed)
Physician Discharge Summary  Michelle Miles:[id number] DOB: 1971-05-01 DOA: 02/01/2013  PCP: Marylen Ponto, MD  Admit date: 02/01/2013 Discharge date: 02/03/2013  Time spent: 35 minutes  Recommendations for Outpatient Follow-up:   See Dr. Maisie Fus of [facility] for evaluation of hemorrhoids in 1 month.  See your primary care physician, Dr. Juleen China, in 1 week for lab work (cbc and bmet) to check your hemoglobin and potassium levels as well as your blood pressure.  Consider checking renin and aldosterone regarding consistently low potassium.  Discharge Diagnoses:  Active Problems:   Rectal bleed   IBS (irritable bowel syndrome)   Hypotension   Discharge Condition:  Stable.    Diet recommendation: Heart Health  Filed Weights   02/01/13 1956 02/03/13 0929  Weight: 78.79 kg (173 lb 11.2 oz) 76.658 kg (169 lb)    History of present illness:  Michelle Miles is a 42 y.o. female with h/o IBS, hypertension, a recent sinus infection, came in for BRBPR since yesterday, about 5 to 7 episodes of painless bleeding from the rectum, not associated with pain, nausea or vomiting. On arrival to ED she was found to have normal H&H, but slightly hypotensive, she is currently asymptomatic from the borderline BP. A CT ABD and pelvis was done and was unremarkable. PR exam shows frank blood She also reports that she used ibuprofen 200 mg tab about 6 a day for a week. She is being admitted to hospitalist service for evaluation of GI BLEED.   Hospital Course:  Patient complained of significant amounts of BRBPR with Clots.  [facility] was consulted and Ms. Stovall underwent Colonoscopy on 02/03/13.  Several significantly sized hemorrhoids with prominent vasculature were observed.  None of them were actively bleeding but they were thought to be the cause of her GI Bleed. Dr. Madilyn Fireman recommended conservative treatment with Anusol suppositories, sitz baths, and tucks pads as well as continued use of stool  softeners.  If these efforts fail, he recommends [facility] (Surgery) consultation.  Ms. Osorno will be discharged today.  She is stable after her procedure.  A [facility] appointment with Dr. Janee Morn has been tentatively scheduled for 3/5.  Ms. Veracruz knows to return to the ED if she has significant bleeding again - for immediate surgical evaluation.  Procedures:  Colonoscopy.  Consultations:  [facility]  Discharge Exam: Filed Vitals:   02/03/13 1058 02/03/13 1100 02/03/13 1110 02/03/13 1120  BP:  103/60 101/66 107/58  Pulse:      Temp: 98.7 F (37.1 C)     TempSrc: Oral     Resp:  20    Height:      Weight:      SpO2:  100% 100% 100%    General: sleepy post procedure.  A&O, NAD.  Husband at bedside. HEENT: No bruits, no goiter.  Heart: Regular rate and rhythm, without murmurs, rubs, gallops.  Lungs: Good air movement, clear to auscultation. No wheeze or crackles  Abdomen: Soft, nontender, nondistended, positive bowel sounds.  Neuro: Grossly intact, nonfocal.   Discharge Instructions      Discharge Orders   Future Appointments Provider Department Dept Phone   02/26/2013 1:30 PM Romie Levee, MD [facility], [place] [phone]   Future Orders Complete By Expires     Diet - low sodium heart healthy  As directed     Increase activity slowly  As directed         Medication List    STOP taking these medications  ibuprofen 200 MG tablet  Commonly known as:  ADVIL,MOTRIN      TAKE these medications       acetaminophen 500 MG tablet  Commonly known as:  TYLENOL  Take 1,000 mg by mouth every 6 (six) hours as needed for pain. For pain     amoxicillin-clavulanate 1000-62.5 MG per tablet  Commonly known as:  AUGMENTIN XR  Take 2 tablets by mouth 2 (two) times daily.     cetirizine 10 MG tablet  Commonly known as:  ZYRTEC  Take 10 mg by mouth daily.     fluticasone 50 MCG/ACT nasal spray  Commonly known as:  FLONASE  Place 2 sprays into the nose  daily.     hydrocortisone 25 MG suppository  Commonly known as:  ANUSOL-HC  Place 1 suppository (25 mg total) rectally 2 (two) times daily.     MULTIVITAMIN PO  Take 1 tablet by mouth daily.     polyethylene glycol packet  Commonly known as:  MIRALAX / GLYCOLAX  Take 17 g by mouth daily.     potassium chloride SA 20 MEQ tablet  Commonly known as:  K-DUR,KLOR-CON  Take 1 tablet (20 mEq total) by mouth 2 (two) times daily.     PROBIOTIC DAILY PO  Take 1 tablet by mouth daily.     triamterene-hydrochlorothiazide 37.5-25 MG per tablet  Commonly known as:  MAXZIDE-25  Take 1 each (1 tablet total) by mouth daily.       Follow-up Information   Follow up with Vanita Panda., MD On 02/26/2013. (1:00 pm)    Contact information:   [address], [address] [place] [address] [phone]        The results of significant diagnostics from this hospitalization (including imaging, microbiology, ancillary and laboratory) are listed below for reference.    Significant Diagnostic Studies: Ct Abdomen Pelvis W Contrast  02/01/2013  *RADIOLOGY REPORT*  Clinical Data: Abdominal and rectal pain with bleeding  CT ABDOMEN AND PELVIS WITH CONTRAST  Technique:  Multidetector CT imaging of the abdomen and pelvis was performed following the standard protocol during bolus administration of intravenous contrast.  Contrast: OMNIPAQUE IOHEXOL 300 MG/ML  SOLN, 50mL OMNIPAQUE IOHEXOL 300 MG/ML  SOLN  Comparison: CT abdomen pelvis of 11/12/2009  Findings: The lung bases are clear.  The liver enhances with no focal abnormality and no ductal dilatation is seen. Surgical clips are present of prior cholecystectomy.  The the pancreas is normal in size and the pancreatic duct is not dilated.  The adrenal glands and spleen are unremarkable.  The stomach is not well distended. The kidneys enhance with no calculus or mass and no hydronephrosis is seen.  On delayed images the pelvocaliceal systems are  unremarkable.  The abdominal aorta is normal in caliber.  No adenopathy is seen.  The uterus appears anteverted and somewhat inhomogeneous and enhancement.  The urinary bladder is unremarkable.  No free fluid is seen within the pelvis.  No edema of the colonic mucosa is seen. The terminal ileum is fluid filled and unremarkable.  The appendix is moderately seen in the right pelvis with no abnormality noted. No skeletal abnormality is seen.  IMPRESSION:  1.  No evidence of colonic mucosal edema is seen. 2.  The appendix and terminal ileum are unremarkable.   Original Report Authenticated By: Dwyane Dee, M.D.     Microbiology: No results found for this or any previous visit (from the past 240 hour(s)).   Labs:  Basic Metabolic Panel:  Recent Labs Lab 02/01/13 1430  NA 137  K 3.5  CL 101  CO2 28  GLUCOSE 82  BUN 13  CREATININE 0.70  CALCIUM 9.3   Liver Function Tests:  Recent Labs Lab 02/01/13 1430  AST 29  ALT 20  ALKPHOS 55  BILITOT 0.2*  PROT 7.8  ALBUMIN 3.9   CBC:  Recent Labs Lab 02/01/13 1430 02/01/13 1902 02/02/13 0225 02/02/13 1019 02/02/13 1823 02/03/13 0237  WBC 6.4  --  6.2  --   --   --   HGB 14.6 12.5 12.7 13.1 13.7 12.1  HCT 42.0 37.6 37.7 38.5 39.6 35.3*  MCV 94.4  --  94.5  --   --   --   PLT 310  --  241  --   --   --    CBG:  Recent Labs Lab 02/03/13 0822  GLUCAP 82       Signed:  Conley Canal [phone] [facility] 02/03/2013, 12:48 PM

## 2013-02-03 NOTE — Progress Notes (Signed)
[facility] Progress Note  Subjective: Patient tolerated bowel prep states she is still having some rectal bleeding  Objective: Vital signs in last 24 hours: Temp:  [97.4 F (36.3 C)-98.3 F (36.8 C)] 98.3 F (36.8 C) (02/10 0929) Pulse Rate:  [70-79] 79 (02/10 0929) Resp:  [8-23] 23 (02/10 1045) BP: (96-122)/(53-69) 108/68 mmHg (02/10 1045) SpO2:  [100 %] 100 % (02/10 1045) Weight:  [76.658 kg (169 lb)] 76.658 kg (169 lb) (02/10 0929) Weight change:    PE: Unchanged  Lab Results: Results for orders placed during the hospital encounter of 02/01/13 (from the past 24 hour(s))  HEMOGLOBIN AND HEMATOCRIT, BLOOD     Status: None   Collection Time    02/02/13  6:23 PM      Result Value Range   Hemoglobin 13.7  12.0 - 15.0 g/dL   HCT 16.1  09.6 - 04.5 %  HEMOGLOBIN AND HEMATOCRIT, BLOOD     Status: Abnormal   Collection Time    02/03/13  2:37 AM      Result Value Range   Hemoglobin 12.1  12.0 - 15.0 g/dL   HCT 40.9 (*) 81.1 - 91.4 %  GLUCOSE, CAPILLARY     Status: None   Collection Time    02/03/13  8:22 AM      Result Value Range   Glucose-Capillary 82  70 - 99 mg/dL    Studies/Results: Ct Abdomen Pelvis W Contrast  02/01/2013  *RADIOLOGY REPORT*  Clinical Data: Abdominal and rectal pain with bleeding  CT ABDOMEN AND PELVIS WITH CONTRAST  Technique:  Multidetector CT imaging of the abdomen and pelvis was performed following the standard protocol during bolus administration of intravenous contrast.  Contrast: OMNIPAQUE IOHEXOL 300 MG/ML  SOLN, 50mL OMNIPAQUE IOHEXOL 300 MG/ML  SOLN  Comparison: CT abdomen pelvis of 11/12/2009  Findings: The lung bases are clear.  The liver enhances with no focal abnormality and no ductal dilatation is seen. Surgical clips are present of prior cholecystectomy.  The the pancreas is normal in size and the pancreatic duct is not dilated.  The adrenal glands and spleen are unremarkable.  The stomach is not well distended. The kidneys  enhance with no calculus or mass and no hydronephrosis is seen.  On delayed images the pelvocaliceal systems are unremarkable.  The abdominal aorta is normal in caliber.  No adenopathy is seen.  The uterus appears anteverted and somewhat inhomogeneous and enhancement.  The urinary bladder is unremarkable.  No free fluid is seen within the pelvis.  No edema of the colonic mucosa is seen. The terminal ileum is fluid filled and unremarkable.  The appendix is moderately seen in the right pelvis with no abnormality noted. No skeletal abnormality is seen.  IMPRESSION:  1.  No evidence of colonic mucosal edema is seen. 2.  The appendix and terminal ileum are unremarkable.   Original Report Authenticated By: Dwyane Dee, M.D.    colonoscopy showed fairly large internal hemorrhoids otherwise normal study    Assessment: Internal hemorrhoids no other source of GI bleeding and no active bleeding during the procedure  Plan: Anusol HC Suppository, sitz baths, Tucks pads et Karie Soda. If bleeding persists referral for hemorrhoidal surgery. Okay for discharge from GI standpoint.    Rayce Brahmbhatt C 02/03/2013, 10:54 AM

## 2013-02-03 NOTE — Progress Notes (Signed)
-   I have review the data and agree with above.

## 2013-02-03 NOTE — Discharge Summary (Signed)
-   I have review the data the data, and agree with above. Follow up with [facility] as an outpatient. - PCP in 1 week for lab work.

## 2013-02-03 NOTE — Progress Notes (Signed)
[facility] PROGRESS NOTE  Assessment/Plan: Active Hospital Problems  Rectal bleed - continued BRBPR,  Hemorrhoidal bleed? - IV protonix  - Stop NSAID'S. - Colonoscopy 2.10.2014   - Recommendations forthcoming based on procedure results.   Hypotension - improved with IV FLUIDS.  - She is currently asymptomatic.  Sinusitis: - flonase, augmentin   Code Status: full code  Family Communication: family at beds ide  Disposition Plan: 2 days    Consultants:  Dr. Madilyn Fireman  Procedures:  Colonoscopy 2.10.2014  Antibiotics:  augmentin  HPI/Subjective: Complains of HA, and Leg cramps overnight.  Reports she has a tendency to quickly drop her potassium.  Objective: Filed Vitals:   02/02/13 1430 02/02/13 2120 02/03/13 0439 02/03/13 0929  BP: 99/61 98/62 102/65   Pulse: 70 71 71 79  Temp: 97.5 F (36.4 C) 97.4 F (36.3 C) 98.1 F (36.7 C) 98.3 F (36.8 C)  TempSrc: Oral Oral Oral Oral  Resp: 18 17 17 15   Height:    5\' 4"  (1.626 m)  Weight:    76.658 kg (169 lb)  SpO2: 100% 100% 100%     Intake/Output Summary (Last 24 hours) at 02/03/13 0952 Last data filed at 02/03/13 0649  Gross per 24 hour  Intake 4061.67 ml  Output   3703 ml  Net 358.67 ml   Filed Weights   02/01/13 1956 02/03/13 0929  Weight: 78.79 kg (173 lb 11.2 oz) 76.658 kg (169 lb)    Exam:  General: Alert, awake, oriented x3, in no acute distress.  Appears well HEENT: No bruits, no goiter.  Heart: Regular rate and rhythm, without murmurs, rubs, gallops.  Lungs: Good air movement, clear to auscultation. No wheeze or crackles Abdomen: Soft, nontender, nondistended, positive bowel sounds.  Neuro: Grossly intact, nonfocal.   Data Reviewed: Basic Metabolic Panel:  Recent Labs Lab 02/01/13 1430  NA 137  K 3.5  CL 101  CO2 28  GLUCOSE 82  BUN 13  CREATININE 0.70  CALCIUM 9.3   Liver Function Tests:  Recent Labs Lab 02/01/13 1430  AST 29  ALT 20  ALKPHOS 55  BILITOT 0.2*   PROT 7.8  ALBUMIN 3.9   CBC:  Recent Labs Lab 02/01/13 1430 02/01/13 1902 02/02/13 0225 02/02/13 1019 02/02/13 1823 02/03/13 0237  WBC 6.4  --  6.2  --   --   --   HGB 14.6 12.5 12.7 13.1 13.7 12.1  HCT 42.0 37.6 37.7 38.5 39.6 35.3*  MCV 94.4  --  94.5  --   --   --   PLT 310  --  241  --   --   --    CBG:  Recent Labs Lab 02/03/13 0822  GLUCAP 82      Studies: Ct Abdomen Pelvis W Contrast  02/01/2013  *RADIOLOGY REPORT*  Clinical Data: Abdominal and rectal pain with bleeding  CT ABDOMEN AND PELVIS WITH CONTRAST  Technique:  Multidetector CT imaging of the abdomen and pelvis was performed following the standard protocol during bolus administration of intravenous contrast.  Contrast: OMNIPAQUE IOHEXOL 300 MG/ML  SOLN, 50mL OMNIPAQUE IOHEXOL 300 MG/ML  SOLN  Comparison: CT abdomen pelvis of 11/12/2009  Findings: The lung bases are clear.  The liver enhances with no focal abnormality and no ductal dilatation is seen. Surgical clips are present of prior cholecystectomy.  The the pancreas is normal in size and the pancreatic duct is not dilated.  The adrenal glands and spleen are unremarkable.  The stomach is not  well distended. The kidneys enhance with no calculus or mass and no hydronephrosis is seen.  On delayed images the pelvocaliceal systems are unremarkable.  The abdominal aorta is normal in caliber.  No adenopathy is seen.  The uterus appears anteverted and somewhat inhomogeneous and enhancement.  The urinary bladder is unremarkable.  No free fluid is seen within the pelvis.  No edema of the colonic mucosa is seen. The terminal ileum is fluid filled and unremarkable.  The appendix is moderately seen in the right pelvis with no abnormality noted. No skeletal abnormality is seen.  IMPRESSION:  1.  No evidence of colonic mucosal edema is seen. 2.  The appendix and terminal ileum are unremarkable.   Original Report Authenticated By: Dwyane Dee, M.D.     Scheduled Meds: .  [MAR HOLD] amoxicillin-clavulanate  1 tablet Oral BID  . [MAR HOLD] fluticasone  2 spray Each Nare Daily  . [MAR HOLD] glucose  1 tablet Oral Once  . [facility] HOLD] loratadine  10 mg Oral Daily  . [MAR HOLD] pantoprazole (PROTONIX) IV  40 mg Intravenous Q24H  . [MAR HOLD] sodium chloride  1,000 mL Intravenous Once  . [facility] HOLD] sodium chloride  3 mL Intravenous Q12H   Continuous Infusions: . sodium chloride 125 mL/hr at 02/02/13 0626  . sodium chloride 500 mL (02/03/13 0933)     Conley Canal  [facility] Pager [phone]  If 8PM-8AM, please contact night-coverage at [email or web address], password [facility] 02/03/2013, 9:52 AM  LOS: 2 days

## 2013-02-03 NOTE — Progress Notes (Signed)
Randa Evens to be D/C'd Home per MD order.  Discussed with the patient and all questions fully answered.    Medication List    STOP taking these medications       ibuprofen 200 MG tablet  Commonly known as:  ADVIL,MOTRIN      TAKE these medications       acetaminophen 500 MG tablet  Commonly known as:  TYLENOL  Take 1,000 mg by mouth every 6 (six) hours as needed for pain. For pain     amoxicillin-clavulanate 1000-62.5 MG per tablet  Commonly known as:  AUGMENTIN XR  Take 2 tablets by mouth 2 (two) times daily.     cetirizine 10 MG tablet  Commonly known as:  ZYRTEC  Take 10 mg by mouth daily.     fluticasone 50 MCG/ACT nasal spray  Commonly known as:  FLONASE  Place 2 sprays into the nose daily.     hydrocortisone 25 MG suppository  Commonly known as:  ANUSOL-HC  Place 1 suppository (25 mg total) rectally 2 (two) times daily.     MULTIVITAMIN PO  Take 1 tablet by mouth daily.     polyethylene glycol packet  Commonly known as:  MIRALAX / GLYCOLAX  Take 17 g by mouth daily.     potassium chloride SA 20 MEQ tablet  Commonly known as:  K-DUR,KLOR-CON  Take 1 tablet (20 mEq total) by mouth 2 (two) times daily.     PROBIOTIC DAILY PO  Take 1 tablet by mouth daily.     triamterene-hydrochlorothiazide 37.5-25 MG per tablet  Commonly known as:  MAXZIDE-25  Take 1 each (1 tablet total) by mouth daily.        VVS, Skin clean, dry and intact without evidence of skin break down, no evidence of skin tears noted. IV catheter discontinued intact. Site without signs and symptoms of complications. Dressing and pressure applied.  An After Visit Summary was printed and given to the patient. Patient escorted via WC, and D/C home via private auto.  Kennyth Arnold D 02/03/2013 1:43 PM

## 2013-02-04 ENCOUNTER — Encounter (HOSPITAL_COMMUNITY): Payer: Self-pay | Admitting: Gastroenterology

## 2013-02-04 NOTE — Care Management Note (Signed)
    Page 1 of 1   02/04/2013     2:27:47 PM   CARE MANAGEMENT NOTE 02/04/2013  Patient:  Michelle Miles, Michelle Miles   Account Number:  [id number]  Date Initiated:  02/04/2013  Documentation initiated by:  Letha Cape  Subjective/Objective Assessment:   dx gib  admit     Action/Plan:   Anticipated DC Date:  02/03/2013   Anticipated DC Plan:  HOME/SELF CARE      DC Planning Services  CM consult      Choice offered to / List presented to:             Status of service:  Completed, signed off Medicare Important Message given?   (If response is "NO", the following Medicare IM given date fields will be blank) Date Medicare IM given:   Date Additional Medicare IM given:    Discharge Disposition:  HOME/SELF CARE  Per UR Regulation:  Reviewed for med. necessity/level of care/duration of stay  If discussed at Long Length of Stay Meetings, dates discussed:    Comments:  02/04/13 14/26 Letha Cape RN, BSN [phone] patient lives with spouse, pta indep.  Patient dc to home no needs identified.

## 2013-02-14 ENCOUNTER — Encounter (INDEPENDENT_AMBULATORY_CARE_PROVIDER_SITE_OTHER): Payer: Self-pay | Admitting: General Surgery

## 2013-02-14 ENCOUNTER — Ambulatory Visit (INDEPENDENT_AMBULATORY_CARE_PROVIDER_SITE_OTHER): Payer: BC Managed Care – PPO | Admitting: General Surgery

## 2013-02-14 VITALS — BP 114/72 | HR 63 | Temp 98.1°F | Ht 64.0 in | Wt 173.4 lb

## 2013-02-14 DIAGNOSIS — K648 Other hemorrhoids: Secondary | ICD-10-CM

## 2013-02-14 NOTE — Progress Notes (Signed)
Subjective:   rectal bleeding  Patient ID: Michelle Miles, female   DOB: 09/03/1971, 42 y.o.   MRN: [id number]  HPI Patient is a 42-year-old female referred by Dr. John Hayes for persistent lower GI bleeding and findings of internal hemorrhoids on exam. About one month ago the patient developed fairly suddenly the onset of frequent bleeding per rectum with bowel movements. She was actually hospitalized from February 8 of February 10 of this year for this problem. She did not require transfusion but was slightly hypotensive on admission. She underwent colonoscopy while in the hospital on February 10 with findings of significant internal hemorrhoids not actively bleeding but otherwise entirely negative. She was discharged home with an elective appointment for surgical followup. She however is continued to have several episodes of bleeding per day with bowel movements. She describes gross blood with bowel movements often clots. This generally has been painless although last night and this morning she states she had pain with her bowel movements. Her stools are soft on stool softeners and MiraLAX. She has some occasional right-sided abdominal pain but nothing severe. No nausea or vomiting. She is referred urgently due to persistent bleeding.  Past Medical History  Diagnosis Date  . Hypokalemia   . Sinus infection   . Non-smoker   . Non-smoker   . PONV (postoperative nausea and vomiting)    Past Surgical History  Procedure Laterality Date  . Cholecystectomy      10 yrs ago  . Foot surgery      right  . Rhinoplasty      for deviated septum  7 yrs ago  . Ablation       lining of the uterus  . Colonoscopy N/A 02/03/2013    Procedure: COLONOSCOPY;  Surgeon: John C Hayes, MD;  Location: [facility] ENDOSCOPY;  Service: Endoscopy;  Laterality: N/A;   Current Outpatient Prescriptions  Medication Sig Dispense Refill  . acetaminophen (TYLENOL) 500 MG tablet Take 1,000 mg by mouth every 6 (six) hours as needed  for pain. For pain      . cetirizine (ZYRTEC) 10 MG tablet Take 10 mg by mouth daily.      . fluticasone (FLONASE) 50 MCG/ACT nasal spray Place 2 sprays into the nose daily.  16 g  2  . Multiple Vitamins-Minerals (MULTIVITAMIN PO) Take 1 tablet by mouth daily.      . polyethylene glycol (MIRALAX / GLYCOLAX) packet Take 17 g by mouth daily.      . potassium chloride SA (K-DUR,KLOR-CON) 20 MEQ tablet Take 1 tablet (20 mEq total) by mouth 2 (two) times daily.      . Probiotic Product (PROBIOTIC DAILY PO) Take 1 tablet by mouth daily.      . triamterene-hydrochlorothiazide (MAXZIDE-25) 37.5-25 MG per tablet Take 1 each (1 tablet total) by mouth daily.      . hydrocortisone (ANUSOL-HC) 25 MG suppository Place 1 suppository (25 mg total) rectally 2 (two) times daily.  43 suppository  0   No current facility-administered medications for this visit.   Allergies  Allergen Reactions  . Caffeine     Makes pt light headed  . Codeine Nausea And Vomiting  . Floxin (Ofloxacin)     Causes hallucinations  . Prednisone Rash   History  Substance Use Topics  . Smoking status: Never Smoker   . Smokeless tobacco: Not on file  . Alcohol Use: No     Review of Systems  Constitutional: Positive for fatigue.  Respiratory: Negative.     Cardiovascular: Negative.   Gastrointestinal: Positive for abdominal pain, blood in stool and anal bleeding. Negative for nausea, vomiting, diarrhea and constipation.  Genitourinary: Negative.   Neurological: Positive for light-headedness.       Objective:   Physical Exam BP 114/72  Pulse 63  Temp(Src) 98.1 F (36.7 C) (Temporal)  Ht 5' 4" (1.626 m)  Wt 173 lb 6.4 oz (78.654 kg)  BMI 29.75 kg/m2  SpO2 98%  General: Alert, well-developed Caucasian female, in no distress Skin: Warm and dry without rash or infection. HEENT: No palpable masses or thyromegaly. Sclera nonicteric. Pupils equal round and reactive. Oropharynx clear. Lymph nodes: No cervical,  supraclavicular, or inguinal nodes palpable. Lungs: Breath sounds clear and equal without increased work of breathing Cardiovascular: Regular rate and rhythm without murmur. No JVD or edema. Peripheral pulses intact. Abdomen: Nondistended. Soft and nontender. No masses palpable. No organomegaly. No palpable hernias. Rectal: An external rectal exam there is just some minimal mucosal prolapse anteriorly. Digital exam shows just some mild tenderness and no blood. Extremities: No edema or joint swelling or deformity. No chronic venous stasis changes. Neurologic: Alert and fully oriented. Gait normal.  Anoscopy: This shows significant circumferential internal hemorrhoids but no active bleeding.    Assessment:     Persistent lower GI bleeding with colonoscopy showing only large internal hemorrhoids which are confirmed on endoscopy today. She has multiple episodes of bleeding daily which is ongoing. This clearly will require intervention. We discussed options available including office procedure such as banding or injection and surgical procedures including PPH. I think due to the circumferential extensive nature of her hemorrhoids and the degree of bleeding in a more definitive procedure such as PPH as indicated. We discussed this including indications and the nature of the procedure and expected recovery as well as risks of anesthetic complications, bleeding, infection, chronic pain, rare risk of rectovaginal fistula and chance of persistent symptoms. 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 We'll schedule this for as soon as possible.    Plan:     PPH under general anesthesia as an outpatient      

## 2013-02-17 ENCOUNTER — Encounter (HOSPITAL_COMMUNITY): Payer: Self-pay | Admitting: *Deleted

## 2013-02-17 NOTE — Progress Notes (Signed)
Ct abd/pelvis 02/01/13 with notation lung bases clear, CBC 2/8 and 02/02/13 EPIC,  CMET, PT/PTT 02/01/13 EPIC

## 2013-02-19 ENCOUNTER — Ambulatory Visit (HOSPITAL_COMMUNITY): Payer: BC Managed Care – PPO

## 2013-02-19 ENCOUNTER — Ambulatory Visit (HOSPITAL_COMMUNITY): Payer: BC Managed Care – PPO | Admitting: Anesthesiology

## 2013-02-19 ENCOUNTER — Encounter (HOSPITAL_COMMUNITY): Payer: Self-pay | Admitting: Anesthesiology

## 2013-02-19 ENCOUNTER — Ambulatory Visit (HOSPITAL_COMMUNITY)
Admission: RE | Admit: 2013-02-19 | Discharge: 2013-02-19 | Disposition: A | Discharge status: Home / Self Care | Payer: BC Managed Care – PPO | Source: Ambulatory Visit | Attending: General Surgery | Admitting: General Surgery

## 2013-02-19 ENCOUNTER — Encounter (HOSPITAL_COMMUNITY)
Admission: RE | Disposition: A | Discharge status: Home / Self Care | Payer: Self-pay | Source: Ambulatory Visit | Attending: General Surgery

## 2013-02-19 ENCOUNTER — Encounter (HOSPITAL_COMMUNITY): Payer: Self-pay | Admitting: *Deleted

## 2013-02-19 DIAGNOSIS — Z79899 Other long term (current) drug therapy: Secondary | ICD-10-CM | POA: Insufficient documentation

## 2013-02-19 DIAGNOSIS — R42 Dizziness and giddiness: Secondary | ICD-10-CM | POA: Insufficient documentation

## 2013-02-19 DIAGNOSIS — R5381 Other malaise: Secondary | ICD-10-CM | POA: Insufficient documentation

## 2013-02-19 DIAGNOSIS — R109 Unspecified abdominal pain: Secondary | ICD-10-CM | POA: Insufficient documentation

## 2013-02-19 DIAGNOSIS — K648 Other hemorrhoids: Secondary | ICD-10-CM

## 2013-02-19 HISTORY — DX: Meniere's disease, unspecified ear: H81.09

## 2013-02-19 HISTORY — PX: HEMORRHOID SURGERY: SHX153

## 2013-02-19 LAB — BASIC METABOLIC PANEL
CO2: 28 mEq/L (ref 19–32)
Chloride: 107 mEq/L (ref 96–112)
Creatinine, Ser: 0.74 mg/dL (ref 0.50–1.10)
Glucose, Bld: 92 mg/dL (ref 70–99)

## 2013-02-19 LAB — CBC
HCT: 32.9 % — ABNORMAL LOW (ref 36.0–46.0)
Hemoglobin: 10.7 g/dL — ABNORMAL LOW (ref 12.0–15.0)
MCV: 96.5 fL (ref 78.0–100.0)
Platelets: 332 10*3/uL (ref 150–400)
RBC: 3.41 MIL/uL — ABNORMAL LOW (ref 3.87–5.11)
WBC: 6.2 10*3/uL (ref 4.0–10.5)

## 2013-02-19 SURGERY — HEMORRHOIDECTOMY PROLAPSED
Anesthesia: General | Site: Anus

## 2013-02-19 MED ORDER — ACETAMINOPHEN 10 MG/ML IV SOLN
INTRAVENOUS | Status: AC
  Filled 2013-02-19: qty 100

## 2013-02-19 MED ORDER — MUPIROCIN 2 % EX OINT
TOPICAL_OINTMENT | Freq: Two times a day (BID) | CUTANEOUS | Status: DC
  Filled 2013-02-19: qty 22

## 2013-02-19 MED ORDER — OXYCODONE-ACETAMINOPHEN 5-325 MG PO TABS: 1.0000 | ORAL_TABLET | ORAL | Status: DC | PRN

## 2013-02-19 MED ORDER — OXYCODONE-ACETAMINOPHEN 5-325 MG PO TABS
1.0000 | ORAL_TABLET | Freq: Four times a day (QID) | ORAL | Status: DC | PRN
  Administered 2013-02-19: 1 via ORAL
  Filled 2013-02-19: qty 1

## 2013-02-19 MED ORDER — BUPIVACAINE-EPINEPHRINE 0.25% -1:200000 IJ SOLN
INTRAMUSCULAR | Status: AC
  Filled 2013-02-19: qty 1

## 2013-02-19 MED ORDER — HYDROMORPHONE HCL PF 1 MG/ML IJ SOLN: 0.2500 mg | INTRAMUSCULAR | Status: DC | PRN

## 2013-02-19 MED ORDER — ONDANSETRON HCL 4 MG/2ML IJ SOLN
INTRAMUSCULAR | Status: DC | PRN
  Administered 2013-02-19: 4 mg via INTRAVENOUS

## 2013-02-19 MED ORDER — FENTANYL CITRATE 0.05 MG/ML IJ SOLN
INTRAMUSCULAR | Status: DC | PRN
  Administered 2013-02-19: 100 ug via INTRAVENOUS
  Administered 2013-02-19 (×3): 50 ug via INTRAVENOUS

## 2013-02-19 MED ORDER — LIDOCAINE-EPINEPHRINE 1 %-1:100000 IJ SOLN
INTRAMUSCULAR | Status: AC
  Filled 2013-02-19: qty 1

## 2013-02-19 MED ORDER — CISATRACURIUM BESYLATE (PF) 10 MG/5ML IV SOLN
INTRAVENOUS | Status: DC | PRN
  Administered 2013-02-19: 4 mg via INTRAVENOUS

## 2013-02-19 MED ORDER — LIDOCAINE-EPINEPHRINE 1 %-1:100000 IJ SOLN
INTRAMUSCULAR | Status: DC | PRN
  Administered 2013-02-19: 9 mL

## 2013-02-19 MED ORDER — LIDOCAINE HCL (CARDIAC) 20 MG/ML IV SOLN
INTRAVENOUS | Status: DC | PRN
  Administered 2013-02-19: 50 mg via INTRAVENOUS

## 2013-02-19 MED ORDER — PROMETHAZINE HCL 25 MG/ML IJ SOLN
6.2500 mg | INTRAMUSCULAR | Status: DC | PRN
  Administered 2013-02-19: 6.25 mg via INTRAVENOUS
  Filled 2013-02-19: qty 1

## 2013-02-19 MED ORDER — LIDOCAINE-EPINEPHRINE (PF) 1 %-1:200000 IJ SOLN
INTRAMUSCULAR | Status: AC
  Filled 2013-02-19: qty 10

## 2013-02-19 MED ORDER — BUPIVACAINE LIPOSOME 1.3 % IJ SUSP
20.0000 mL | INTRAMUSCULAR | Status: AC
  Administered 2013-02-19: 20 mL
  Filled 2013-02-19: qty 20

## 2013-02-19 MED ORDER — DEXTROSE 5 % IV SOLN
2.0000 g | INTRAVENOUS | Status: AC
  Administered 2013-02-19: 2 g via INTRAVENOUS

## 2013-02-19 MED ORDER — SUCCINYLCHOLINE CHLORIDE 20 MG/ML IJ SOLN
INTRAMUSCULAR | Status: DC | PRN
  Administered 2013-02-19: 100 mg via INTRAVENOUS

## 2013-02-19 MED ORDER — PHENYLEPHRINE HCL 10 MG/ML IJ SOLN
INTRAMUSCULAR | Status: DC | PRN
  Administered 2013-02-19: 40 ug via INTRAVENOUS

## 2013-02-19 MED ORDER — HYALURONIDASE OVINE 200 UNIT/ML IJ SOLN
INTRAMUSCULAR | Status: AC
  Filled 2013-02-19: qty 1.2

## 2013-02-19 MED ORDER — SCOPOLAMINE 1 MG/3DAYS TD PT72
1.0000 | MEDICATED_PATCH | TRANSDERMAL | Status: DC
  Administered 2013-02-19: 1.5 mg via TRANSDERMAL
  Filled 2013-02-19: qty 1

## 2013-02-19 MED ORDER — HYALURONIDASE OVINE 200 UNIT/ML IJ SOLN
INTRAMUSCULAR | Status: DC | PRN
  Administered 2013-02-19: 200 [IU] via SUBCUTANEOUS

## 2013-02-19 MED ORDER — LACTATED RINGERS IV SOLN
INTRAVENOUS | Status: DC
  Administered 2013-02-19: 12:00:00 via INTRAVENOUS
  Administered 2013-02-19: 1000 mL via INTRAVENOUS

## 2013-02-19 MED ORDER — LIDOCAINE HCL 2 % EX GEL
CUTANEOUS | Status: AC
  Filled 2013-02-19: qty 10

## 2013-02-19 MED ORDER — MIDAZOLAM HCL 5 MG/5ML IJ SOLN
INTRAMUSCULAR | Status: DC | PRN
  Administered 2013-02-19: 2 mg via INTRAVENOUS

## 2013-02-19 MED ORDER — ACETAMINOPHEN 10 MG/ML IV SOLN
1000.0000 mg | Freq: Once | INTRAVENOUS | Status: AC | PRN
  Administered 2013-02-19: 1000 mg via INTRAVENOUS
  Filled 2013-02-19: qty 100

## 2013-02-19 MED ORDER — CEFOXITIN SODIUM-DEXTROSE 1-4 GM-% IV SOLR (PREMIX)
INTRAVENOUS | Status: AC
  Filled 2013-02-19: qty 100

## 2013-02-19 MED ORDER — SCOPOLAMINE 1 MG/3DAYS TD PT72
MEDICATED_PATCH | TRANSDERMAL | Status: AC
  Filled 2013-02-19: qty 1

## 2013-02-19 MED ORDER — GLYCOPYRROLATE 0.2 MG/ML IJ SOLN
INTRAMUSCULAR | Status: DC | PRN
  Administered 2013-02-19: 0.6 mg via INTRAVENOUS

## 2013-02-19 MED ORDER — MEPERIDINE HCL 50 MG/ML IJ SOLN: 6.2500 mg | INTRAMUSCULAR | Status: DC | PRN

## 2013-02-19 MED ORDER — DEXAMETHASONE SODIUM PHOSPHATE 10 MG/ML IJ SOLN
INTRAMUSCULAR | Status: DC | PRN
  Administered 2013-02-19: 5 mg via INTRAVENOUS

## 2013-02-19 MED ORDER — PROPOFOL 10 MG/ML IV BOLUS
INTRAVENOUS | Status: DC | PRN
  Administered 2013-02-19: 200 mg via INTRAVENOUS

## 2013-02-19 MED ORDER — DIBUCAINE 1 % RE OINT
TOPICAL_OINTMENT | RECTAL | Status: AC
  Filled 2013-02-19: qty 28

## 2013-02-19 MED ORDER — NEOSTIGMINE METHYLSULFATE 1 MG/ML IJ SOLN
INTRAMUSCULAR | Status: DC | PRN
  Administered 2013-02-19: 2 mg via INTRAVENOUS

## 2013-02-19 SURGICAL SUPPLY — 33 items
APL SKNCLS STERI-STRIP NONHPOA (GAUZE/BANDAGES/DRESSINGS) ×1
BENZOIN TINCTURE PRP APPL 2/3 (GAUZE/BANDAGES/DRESSINGS) ×2 IMPLANT
BLADE EXTENDED COATED 6.5IN (ELECTRODE) IMPLANT
BLADE HEX COATED 2.75 (ELECTRODE) ×2 IMPLANT
BRIEF STRETCH FOR OB PAD LRG (UNDERPADS AND DIAPERS) ×2 IMPLANT
CANISTER SUCTION 2500CC (MISCELLANEOUS) ×2 IMPLANT
CLOTH BEACON ORANGE TIMEOUT ST (SAFETY) ×2 IMPLANT
CONT SPEC 4OZ CLIKSEAL STRL BL (MISCELLANEOUS) ×1 IMPLANT
DECANTER SPIKE VIAL GLASS SM (MISCELLANEOUS) ×2 IMPLANT
DRAPE LAPAROTOMY T 102X78X121 (DRAPES) ×2 IMPLANT
DRSG PAD ABDOMINAL 8X10 ST (GAUZE/BANDAGES/DRESSINGS) IMPLANT
ELECT REM PT RETURN 9FT ADLT (ELECTROSURGICAL) ×2
ELECTRODE REM PT RTRN 9FT ADLT (ELECTROSURGICAL) ×1 IMPLANT
GAUZE SPONGE 4X4 16PLY XRAY LF (GAUZE/BANDAGES/DRESSINGS) ×2 IMPLANT
GOWN STRL NON-REIN LRG LVL3 (GOWN DISPOSABLE) ×2 IMPLANT
GOWN STRL REIN XL XLG (GOWN DISPOSABLE) ×4 IMPLANT
KIT BASIN OR (CUSTOM PROCEDURE TRAY) ×2 IMPLANT
LUBRICANT JELLY K Y 4OZ (MISCELLANEOUS) ×2 IMPLANT
NDL SAFETY ECLIPSE 18X1.5 (NEEDLE) IMPLANT
NEEDLE HYPO 18GX1.5 SHARP (NEEDLE) ×2
NS IRRIG 1000ML POUR BTL (IV SOLUTION) ×2 IMPLANT
PACK BASIC VI WITH GOWN DISP (CUSTOM PROCEDURE TRAY) ×2 IMPLANT
PENCIL BUTTON HOLSTER BLD 10FT (ELECTRODE) ×2 IMPLANT
SPONGE GAUZE 4X4 12PLY (GAUZE/BANDAGES/DRESSINGS) ×2 IMPLANT
SPONGE HEMORRHOID 8X3CM (HEMOSTASIS) IMPLANT
SPONGE SURGIFOAM ABS GEL 100 (HEMOSTASIS) IMPLANT
STAPLER PROXIMATE HCS (STAPLE) IMPLANT
STAPLER VISISTAT 35W (STAPLE) ×2 IMPLANT
SUT PROLENE 2 0 BLUE (SUTURE) ×2 IMPLANT
SUT VIC AB 4-0 SH 18 (SUTURE) IMPLANT
SYR CONTROL 10ML LL (SYRINGE) ×2 IMPLANT
TOWEL OR 17X26 10 PK STRL BLUE (TOWEL DISPOSABLE) ×2 IMPLANT
YANKAUER SUCT BULB TIP 10FT TU (MISCELLANEOUS) ×2 IMPLANT

## 2013-02-19 NOTE — H&P (View-Only) (Signed)
Subjective:   rectal bleeding  Patient ID: Michelle Miles, female   DOB: 1971-03-27, 42 y.o.   MRN: [id number]  HPI Patient is a 42 year old female referred by Dr. Dorena Cookey for persistent lower GI bleeding and findings of internal hemorrhoids on exam. About one month ago the patient developed fairly suddenly the onset of frequent bleeding per rectum with bowel movements. She was actually hospitalized from February 8 of February 10 of this year for this problem. She did not require transfusion but was slightly hypotensive on admission. She underwent colonoscopy while in the hospital on February 10 with findings of significant internal hemorrhoids not actively bleeding but otherwise entirely negative. She was discharged home with an elective appointment for surgical followup. She however is continued to have several episodes of bleeding per day with bowel movements. She describes gross blood with bowel movements often clots. This generally has been painless although last night and this morning she states she had pain with her bowel movements. Her stools are soft on stool softeners and MiraLAX. She has some occasional right-sided abdominal pain but nothing severe. No nausea or vomiting. She is referred urgently due to persistent bleeding.  Past Medical History  Diagnosis Date  . Hypokalemia   . Sinus infection   . Non-smoker   . Non-smoker   . PONV (postoperative nausea and vomiting)    Past Surgical History  Procedure Laterality Date  . Cholecystectomy      10 yrs ago  . Foot surgery      right  . Rhinoplasty      for deviated septum  7 yrs ago  . Ablation       lining of the uterus  . Colonoscopy N/A 02/03/2013    Procedure: COLONOSCOPY;  Surgeon: Barrie Folk, MD;  Location: [facility] ENDOSCOPY;  Service: Endoscopy;  Laterality: N/A;   Current Outpatient Prescriptions  Medication Sig Dispense Refill  . acetaminophen (TYLENOL) 500 MG tablet Take 1,000 mg by mouth every 6 (six) hours as needed  for pain. For pain      . cetirizine (ZYRTEC) 10 MG tablet Take 10 mg by mouth daily.      . fluticasone (FLONASE) 50 MCG/ACT nasal spray Place 2 sprays into the nose daily.  16 g  2  . Multiple Vitamins-Minerals (MULTIVITAMIN PO) Take 1 tablet by mouth daily.      . polyethylene glycol (MIRALAX / GLYCOLAX) packet Take 17 g by mouth daily.      . potassium chloride SA (K-DUR,KLOR-CON) 20 MEQ tablet Take 1 tablet (20 mEq total) by mouth 2 (two) times daily.      . Probiotic Product (PROBIOTIC DAILY PO) Take 1 tablet by mouth daily.      Marland Kitchen triamterene-hydrochlorothiazide (MAXZIDE-25) 37.5-25 MG per tablet Take 1 each (1 tablet total) by mouth daily.      . hydrocortisone (ANUSOL-HC) 25 MG suppository Place 1 suppository (25 mg total) rectally 2 (two) times daily.  42 suppository  0   No current facility-administered medications for this visit.   Allergies  Allergen Reactions  . Caffeine     Makes pt light headed  . Codeine Nausea And Vomiting  . Floxin (Ofloxacin)     Causes hallucinations  . Prednisone Rash   History  Substance Use Topics  . Smoking status: Never Smoker   . Smokeless tobacco: Not on file  . Alcohol Use: No     Review of Systems  Constitutional: Positive for fatigue.  Respiratory: Negative.  Cardiovascular: Negative.   Gastrointestinal: Positive for abdominal pain, blood in stool and anal bleeding. Negative for nausea, vomiting, diarrhea and constipation.  Genitourinary: Negative.   Neurological: Positive for light-headedness.       Objective:   Physical Exam BP 114/72  Pulse 63  Temp(Src) 98.1 F (36.7 C) (Temporal)  Ht 5\' 4"  (1.626 m)  Wt 173 lb 6.4 oz (78.654 kg)  BMI 29.75 kg/m2  SpO2 98%  General: Alert, well-developed Caucasian female, in no distress Skin: Warm and dry without rash or infection. HEENT: No palpable masses or thyromegaly. Sclera nonicteric. Pupils equal round and reactive. Oropharynx clear. Lymph nodes: No cervical,  supraclavicular, or inguinal nodes palpable. Lungs: Breath sounds clear and equal without increased work of breathing Cardiovascular: Regular rate and rhythm without murmur. No JVD or edema. Peripheral pulses intact. Abdomen: Nondistended. Soft and nontender. No masses palpable. No organomegaly. No palpable hernias. Rectal: An external rectal exam there is just some minimal mucosal prolapse anteriorly. Digital exam shows just some mild tenderness and no blood. Extremities: No edema or joint swelling or deformity. No chronic venous stasis changes. Neurologic: Alert and fully oriented. Gait normal.  Anoscopy: This shows significant circumferential internal hemorrhoids but no active bleeding.    Assessment:     Persistent lower GI bleeding with colonoscopy showing only large internal hemorrhoids which are confirmed on endoscopy today. She has multiple episodes of bleeding daily which is ongoing. This clearly will require intervention. We discussed options available including office procedure such as banding or injection and surgical procedures including PPH. I think due to the circumferential extensive nature of her hemorrhoids and the degree of bleeding in a more definitive procedure such as PPH as indicated. We discussed this including indications and the nature of the procedure and expected recovery as well as risks of anesthetic complications, bleeding, infection, chronic pain, rare risk of rectovaginal fistula and chance of persistent symptoms. 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 We'll schedule this for as soon as possible.    Plan:     PPH under general anesthesia as an outpatient

## 2013-02-19 NOTE — Progress Notes (Signed)
Dr. Renold Don in- aware of patient's blood pressures and heart rates in PACU

## 2013-02-19 NOTE — Anesthesia Preprocedure Evaluation (Addendum)
Anesthesia Evaluation  Patient identified by MRN, date of birth, ID band Patient awake    Reviewed: Allergy & Precautions, H&P , NPO status , Patient's Chart, lab work & pertinent test results  History of Anesthesia Complications (+) PONV  Airway Mallampati: II TM Distance: >3 FB Neck ROM: Full    Dental  (+) Dental Advisory Given and Teeth Intact   Pulmonary neg pulmonary ROS,  breath sounds clear to auscultation  Pulmonary exam normal       Cardiovascular negative cardio ROS  Rhythm:Regular Rate:Normal     Neuro/Psych negative neurological ROS  negative psych ROS   GI/Hepatic negative GI ROS, Neg liver ROS,   Endo/Other  negative endocrine ROS  Renal/GU negative Renal ROS     Musculoskeletal negative musculoskeletal ROS (+)   Abdominal   Peds  Hematology negative hematology ROS (+)   Anesthesia Other Findings   Reproductive/Obstetrics negative OB ROS                          Anesthesia Physical Anesthesia Plan  ASA: II  Anesthesia Plan: General   Post-op Pain Management:    Induction: Intravenous  Airway Management Planned: Oral ETT  Additional Equipment:   Intra-op Plan:   Post-operative Plan: Extubation in OR  Informed Consent: I have reviewed the patients History and Physical, chart, labs and discussed the procedure including the risks, benefits and alternatives for the proposed anesthesia with the patient or authorized representative who has indicated his/her understanding and acceptance.   Dental advisory given  Plan Discussed with: CRNA  Anesthesia Plan Comments:         Anesthesia Quick Evaluation

## 2013-02-19 NOTE — Interval H&P Note (Signed)
History and Physical Interval Note:  02/19/2013 11:27 AM  Michelle Miles  has presented today for surgery, with the diagnosis of bleeding internal hemorrhoids   The various methods of treatment have been discussed with the patient and family. After consideration of risks, benefits and other options for treatment, the patient has consented to  Procedure(s): PPH (N/A) as a surgical intervention .  The patient's history has been reviewed, patient examined, no change in status, stable for surgery.  I have reviewed the patient's chart and labs.  Questions were answered to the patient's satisfaction.     Markas Aldredge T

## 2013-02-19 NOTE — Transfer of Care (Signed)
Immediate Anesthesia Transfer of Care Note  Patient: Michelle Miles  Procedure(s) Performed: Procedure(s): PPH (N/A)  Patient Location: PACU  Anesthesia Type:General  Level of Consciousness: awake, oriented and sedated  Airway & Oxygen Therapy: Patient Spontanous Breathing and Patient connected to face mask oxygen  Post-op Assessment: Report given to PACU RN  Post vital signs: Reviewed and stable  Complications: No apparent anesthesia complications

## 2013-02-19 NOTE — Anesthesia Procedure Notes (Signed)
Procedure Name: Intubation Date/Time: 02/19/2013 11:40 AM Performed by: Hulan Fess Pre-anesthesia Checklist: Patient identified, Emergency Drugs available, Suction available, Patient being monitored and Timeout performed Patient Re-evaluated:Patient Re-evaluated prior to inductionOxygen Delivery Method: Circle system utilized Preoxygenation: Pre-oxygenation with 100% oxygen Ventilation: Mask ventilation without difficulty Laryngoscope Size: Mac and 3 Grade View: Grade I Tube type: Oral Tube size: 8.0 mm Number of attempts: 1 Placement Confirmation: ETT inserted through vocal cords under direct vision and positive ETCO2 Secured at: 22 cm Tube secured with: Tape Dental Injury: Teeth and Oropharynx as per pre-operative assessment

## 2013-02-19 NOTE — Op Note (Signed)
Preoperative Diagnosis: bleeding internal hemorrhoids   Postoprative Diagnosis: bleeding internal hemorrhoids   Procedure: Procedure(s): PPH   Surgeon: Glenna Fellows T   Assistants: none  Anesthesia:  General endotracheal anesthesiaDiagnos  Indications:   Patient is a 42 year old female who has recently developed frequent right red blood per rectum with bowel movements. She was hospitalized for this a couple of weeks ago. Workup including complete colonoscopy was negative except for large internal hemorrhoids. She has continued to have bleeding several times per day. We have discussed options for treatment and have elected to proceed with PPH. We discussed alternatives and we discussed the procedure in detail including risks of general anesthesia, bleeding, infection, chronic pain and rare risk of incontinence or rectovaginal fistula. We discussed failure to relieve symptoms. She is brought to the operating room for this procedure.  Procedure Detail:  Patient is brought to the operating room and general endotracheal anesthesia was induced to the stretcher and she was carefully rolled into prone jackknife position carefully padded. The buttocks were taped apart and the perineum was widely sterilely prepped and draped. She received preoperative antibiotics. She had had a rectal prep the morning of surgery. Patient timeout was performed and correct procedure verified. The anus was gently dilated to 3 fingers and then examined with an endoscope. There were circumferential good-sized internal hemorrhoids and no other pathology seen. The large bullet retractor was placed with excellent visualization. A 2-0 Prolene pursestring suture was then placed through the mucosa and submucosa of the rectum circumferentially carefully measuring 5 cm above the dentate line. The retractor was removed and the PPH retractor placed. The staple line was palpated and was circumferential and uniform. The open stapler  and was introduced through the pursestring which was then tied down around the shaft of the stapler and the tails brought back through the stapler and tied and secured. The stapler was then closed and advanced into the rectum and seen to advance approximately 5 cm and it was tightly closed and left closed for 2 minutes for hemostasis. The stapler was then fired and removed without difficulty. The tissue donut was removed and we had a nice 2 cm fully intact cuff of mucosa and submucosa. I had palpated through the vagina prior to firing the stapler and there was no impingement on the vagina. The staple line was visualized and was intact and not bleeding. A Gelfoam pack was placed. A perirectal block with 20 cc of Exparel was placed. Sponge needle and instrument counts were correct.  Findings: Circumferential large internal hemorrhoids  Estimated Blood Loss:  Minimal         Drains: none  Blood Given: none          Specimens: rectal mucosa and submucosa (PPH hemorrhoidectomy)        Complications:  * No complications entered in OR log *         Disposition: PACU - hemodynamically stable.         Condition: stable

## 2013-02-19 NOTE — Anesthesia Postprocedure Evaluation (Signed)
Anesthesia Post Note  Patient: Michelle Miles  Procedure(s) Performed: Procedure(s) (LRB): PPH (N/A)  Anesthesia type: General  Patient location: PACU  Post pain: Pain level controlled  Post assessment: Post-op Vital signs reviewed  Last Vitals: BP 113/59  Pulse 67  Temp(Src) 36.8 C (Oral)  Resp 11  Ht 5\' 4"  (1.626 m)  Wt 168 lb (76.204 kg)  BMI 28.82 kg/m2  SpO2 100%  Post vital signs: Reviewed  Level of consciousness: sedated  Complications: No apparent anesthesia complications

## 2013-02-19 NOTE — Progress Notes (Addendum)
Pt up and down to BR multiple times. Complains of pressure. Bladder scanned for 70cc of urine.  1700 Pt up to bathroom. Able to ambulate twice around short stay and tolerated well. RN looks for scopalamine patch behind ear and unable to see one.   1900  Patient was able to void on her own without being cathed. Voided moderate amount of clear dark amber urine.

## 2013-02-20 ENCOUNTER — Encounter (HOSPITAL_COMMUNITY): Payer: Self-pay | Admitting: General Surgery

## 2013-02-24 ENCOUNTER — Telehealth (INDEPENDENT_AMBULATORY_CARE_PROVIDER_SITE_OTHER): Payer: Self-pay | Admitting: General Surgery

## 2013-02-24 ENCOUNTER — Encounter (INDEPENDENT_AMBULATORY_CARE_PROVIDER_SITE_OTHER): Payer: Self-pay | Admitting: General Surgery

## 2013-02-24 NOTE — Telephone Encounter (Signed)
Pt called to ask generalized questions about her recent surgery; answered and reassured.  Also needs RTW letter for Monday, March 03, 2013; done and Leandra Kern to her husband Lorin Picket [place]) at [phone].  Pt is running a low grade fever that she knows is a sinus infection.  She is taking Tylenol for it; fever is averaging 99.9.  Taking Augmentin and decongestant for the sinuses.

## 2013-02-26 ENCOUNTER — Ambulatory Visit (INDEPENDENT_AMBULATORY_CARE_PROVIDER_SITE_OTHER): Payer: Self-pay | Admitting: General Surgery

## 2013-03-14 ENCOUNTER — Ambulatory Visit (INDEPENDENT_AMBULATORY_CARE_PROVIDER_SITE_OTHER): Payer: BC Managed Care – PPO | Admitting: General Surgery

## 2013-03-14 ENCOUNTER — Encounter (INDEPENDENT_AMBULATORY_CARE_PROVIDER_SITE_OTHER): Payer: Self-pay | Admitting: General Surgery

## 2013-03-14 VITALS — BP 136/78 | HR 95 | Temp 98.2°F | Resp 18 | Ht 64.0 in | Wt 178.0 lb

## 2013-03-14 DIAGNOSIS — Z09 Encounter for follow-up examination after completed treatment for conditions other than malignant neoplasm: Secondary | ICD-10-CM

## 2013-03-14 NOTE — Patient Instructions (Addendum)
I would expect your soreness to go away completely and bleeding completely stopped within the next several weeks. Call us as needed for any concerns.

## 2013-03-14 NOTE — Progress Notes (Signed)
History: Patient returns for her postoperative visit following PPH hemorrhoidectomy for prolapsing internal hemorrhoids with severe daily bleeding. She has gotten along quite well. She took pain medication for about 2 days but since then has just had some gradually improving soreness. The daily bleeding that she was having had stopped although she had a little streaking on her stool yesterday. She is very pleased with how she is doing.  Exam: BP 136/78  Pulse 95  Temp(Src) 98.2 F (36.8 C)  Resp 18  Ht 5\' 4"  (1.626 m)  Wt 178 lb (80.74 kg)  BMI 30.54 kg/m2 General: Appears well Rectal: External exam normal. On digital exam there is minimal if any tenderness in the staple line is soft and widely patent.  Assessment and plan: Doing well following PPH with no evidence of complication and good symptom relief. She is doing well enough that I did not give her a return appointment I asked her to call as needed for any concerns or recurrent symptoms.

## 2015-11-21 ENCOUNTER — Emergency Department (HOSPITAL_COMMUNITY)
Admission: EM | Admit: 2015-11-21 | Discharge: 2015-11-22 | Disposition: A | Discharge status: Home / Self Care | Payer: BC Managed Care – PPO | Attending: Emergency Medicine | Admitting: Emergency Medicine

## 2015-11-21 ENCOUNTER — Emergency Department (HOSPITAL_COMMUNITY): Payer: BC Managed Care – PPO

## 2015-11-21 ENCOUNTER — Encounter (HOSPITAL_COMMUNITY): Payer: Self-pay | Admitting: *Deleted

## 2015-11-21 DIAGNOSIS — Z7951 Long term (current) use of inhaled steroids: Secondary | ICD-10-CM | POA: Insufficient documentation

## 2015-11-21 DIAGNOSIS — Z7952 Long term (current) use of systemic steroids: Secondary | ICD-10-CM | POA: Insufficient documentation

## 2015-11-21 DIAGNOSIS — Z8669 Personal history of other diseases of the nervous system and sense organs: Secondary | ICD-10-CM | POA: Insufficient documentation

## 2015-11-21 DIAGNOSIS — I493 Ventricular premature depolarization: Secondary | ICD-10-CM | POA: Diagnosis not present

## 2015-11-21 DIAGNOSIS — Z3202 Encounter for pregnancy test, result negative: Secondary | ICD-10-CM | POA: Diagnosis not present

## 2015-11-21 DIAGNOSIS — Z79899 Other long term (current) drug therapy: Secondary | ICD-10-CM | POA: Diagnosis not present

## 2015-11-21 DIAGNOSIS — E876 Hypokalemia: Secondary | ICD-10-CM | POA: Insufficient documentation

## 2015-11-21 DIAGNOSIS — Z8709 Personal history of other diseases of the respiratory system: Secondary | ICD-10-CM | POA: Insufficient documentation

## 2015-11-21 DIAGNOSIS — R002 Palpitations: Secondary | ICD-10-CM | POA: Diagnosis present

## 2015-11-21 LAB — BASIC METABOLIC PANEL
ANION GAP: 8 (ref 5–15)
BUN: 9 mg/dL (ref 6–20)
CO2: 23 mmol/L (ref 22–32)
Calcium: 9.6 mg/dL (ref 8.9–10.3)
Chloride: 106 mmol/L (ref 101–111)
Creatinine, Ser: 0.86 mg/dL (ref 0.44–1.00)
GFR calc Af Amer: >60 mL/min (ref >60)
GLUCOSE: 88 mg/dL (ref 65–99)
POTASSIUM: 3.3 mmol/L — AB (ref 3.5–5.1)
Sodium: 137 mmol/L (ref 135–145)

## 2015-11-21 LAB — HCG, SERUM, QUALITATIVE: PREG SERUM: NEGATIVE

## 2015-11-21 LAB — CBC
HEMATOCRIT: 42.8 % (ref 36.0–46.0)
HEMOGLOBIN: 14.8 g/dL (ref 12.0–15.0)
MCH: 32.6 pg (ref 26.0–34.0)
MCHC: 34.6 g/dL (ref 30.0–36.0)
MCV: 94.3 fL (ref 78.0–100.0)
Platelets: 257 10*3/uL (ref 150–400)
RBC: 4.54 MIL/uL (ref 3.87–5.11)
RDW: 12.4 % (ref 11.5–15.5)
WBC: 7.8 10*3/uL (ref 4.0–10.5)

## 2015-11-21 LAB — MAGNESIUM: MAGNESIUM: 2.2 mg/dL (ref 1.7–2.4)

## 2015-11-21 LAB — TROPONIN I: Troponin I: <0.03 ng/mL (ref <0.031)

## 2015-11-21 MED ORDER — POTASSIUM CHLORIDE CRYS ER 20 MEQ PO TBCR
40.0000 meq | EXTENDED_RELEASE_TABLET | Freq: Once | ORAL | Status: AC
  Administered 2015-11-21: 40 meq via ORAL
  Filled 2015-11-21: qty 2

## 2015-11-21 MED ORDER — ALBUTEROL SULFATE HFA 108 (90 BASE) MCG/ACT IN AERS
2.0000 | INHALATION_SPRAY | Freq: Once | RESPIRATORY_TRACT | Status: AC
  Administered 2015-11-21: 2 via RESPIRATORY_TRACT
  Filled 2015-11-21: qty 6.7

## 2015-11-21 NOTE — ED Notes (Signed)
The pt is c/o chest tightness and tachycardia for one week.  The pt is hyperventilating at present. And she is c/o tingling in her arms  lmp none

## 2015-11-21 NOTE — ED Notes (Signed)
Pt st's rapid heart rate has been waking her up at night for past week.  Also st's in the mornings.

## 2015-11-21 NOTE — ED Notes (Signed)
She has a history of the same with low potassium

## 2015-11-22 LAB — I-STAT TROPONIN, ED: TROPONIN I, POC: 0.01 ng/mL (ref 0.00–0.08)

## 2015-11-22 LAB — TSH: TSH: 5.263 u[IU]/mL — AB (ref 0.350–4.500)

## 2015-11-22 LAB — T4, FREE: FREE T4: 0.72 ng/dL (ref 0.61–1.12)

## 2015-11-22 NOTE — ED Provider Notes (Signed)
CSN: [id number]     Arrival date & time 11/21/15  2010 History   First MD Initiated Contact with Patient 11/21/15 2149     Chief Complaint  Patient presents with  . Tachycardia     (Consider location/radiation/quality/duration/timing/severity/associated sxs/prior Treatment) HPI  44 year old female who presents with palpitations. Has a history of recurrent hypokalemia Mnire's disease. Says that she has had palpitations and chest fluttering in the past in the setting of hypokalemia. Does normally take potassium supplementation which she is compliant with. States that for the past several days she has had intermittent fluttering and palpitations in her chest. It is associated with some mild chest tightness. It is not exertional in nature, and has not had any syncope or near syncope. Has not had any dyspnea or dyspnea on exertion. No recent illnesses and denies any vomiting, diarrhea, nausea, abdominal pain, or urinary symptoms. Has had a nonproductive cough she status, but no congestion, sore throat, runny nose, fevers or chills. Does not drink caffeinated beverages and or any energy drinks. Does not have any leg swelling or leg pain. No prior history of PE/DVT or family history of PE/DVT. No recent immobilization or recent travel.  Past Medical History  Diagnosis Date  . Hypokalemia   . Sinus infection     02/18/12 states resolved  . Non-smoker   . Non-smoker   . PONV (postoperative nausea and vomiting)     also "slow to wake"  . Meniere's disease     trated with maxide   Past Surgical History  Procedure Laterality Date  . Cholecystectomy      10 yrs ago  . Foot surgery      right  . Rhinoplasty      for deviated septum  7 yrs ago  . Ablation       lining of the uterus  . Colonoscopy N/A 02/03/2013    Procedure: COLONOSCOPY;  Surgeon: Barrie FolkJohn C Hayes, MD;  [facility];  Service: Endoscopy;  Laterality: N/A;  . Carpal tunnel release Right   . Hemorrhoid surgery N/A  02/19/2013    Procedure: PROCEDURE PROLAPSED HEMORRHOIDS;  Surgeon: Mariella SaaBenjamin T Hoxworth, MD;  Location: [facility] ORS;  Service: General;  Laterality: N/A;   Family History  Problem Relation Age of Onset  . Stroke Father   . Cancer Maternal Grandmother     breast   Social History  Substance Use Topics  . Smoking status: Never Smoker   . Smokeless tobacco: Never Used  . Alcohol Use: No   OB History    No data available     Review of Systems 10/14 systems reviewed and are negative other than those stated in the HPI   Allergies  Caffeine; Codeine; Floxin; and Prednisone  Home Medications   Prior to Admission medications   Medication Sig Start Date End Date Taking? Authorizing Provider  Ascorbic Acid (VITAMIN C PO) Take 1 tablet by mouth daily.   Yes Historical Provider, MD  escitalopram (LEXAPRO) 5 MG tablet Take 5 mg by mouth daily. 10/11/15  Yes Historical Provider, MD  ibuprofen (ADVIL,MOTRIN) 200 MG tablet Take 400 mg by mouth every 6 (six) hours as needed for moderate pain.   Yes Historical Provider, MD  loratadine (CLARITIN) 10 MG tablet Take 10 mg by mouth daily.   Yes Historical Provider, MD  polyethylene glycol (MIRALAX / GLYCOLAX) packet Take 17 g by mouth daily.   Yes Historical Provider, MD  potassium chloride SA (K-DUR,KLOR-CON) 20 MEQ tablet Take 1 tablet (  20 mEq total) by mouth 2 (two) times daily. Patient taking differently: Take 40 mEq by mouth See admin instructions. Takes once daily, then can take 1-2 extra tablets as needed, depending on how patient is feeling 02/03/13  Yes Tora Kindred York, PA-C  Probiotic Product (PROBIOTIC DAILY PO) Take 1 tablet by mouth daily.   Yes Historical Provider, MD  triamterene-hydrochlorothiazide (MAXZIDE-25) 37.5-25 MG per tablet Take 1 each (1 tablet total) by mouth daily. 02/03/13  Yes Marianne L York, PA-C  VITAMIN E PO Take 1 tablet by mouth daily.   Yes Historical Provider, MD  fluticasone (FLONASE) 50 MCG/ACT nasal spray Place  2 sprays into the nose daily. 02/03/13   Stephani Police, PA-C  hydrocortisone (ANUSOL-HC) 25 MG suppository Place 1 suppository (25 mg total) rectally 2 (two) times daily. 02/03/13   Stephani Police, PA-C  oxyCODONE-acetaminophen (ROXICET) 5-325 MG per tablet Take 1-2 tablets by mouth every 4 (four) hours as needed for pain. 02/19/13   Glenna Fellows, MD   BP 99/52 mmHg  Pulse 83  Temp(Src) 98.1 F (36.7 C)  Resp 13  Ht  (1.626 m)  Wt 202 lb (91.627 kg)  BMI 34.66 kg/m2  SpO2 97% Physical Exam Physical Exam  Nursing note and vitals reviewed. Constitutional: Well developed, well nourished, non-toxic, and in no acute distress Head: Normocephalic and atraumatic.  Mouth/Throat: Oropharynx is clear and moist.  Neck: Normal range of motion. Neck supple.  Cardiovascular: Normal rate and regular rhythm.  No edema. Pulmonary/Chest: Effort normal and breath sounds normal.  Abdominal: Soft. There is no tenderness. There is no rebound and no guarding.  Musculoskeletal: Normal range of motion.  Neurological: Alert, no facial droop, fluent speech, moves all extremities symmetrically Skin: Skin is warm and dry.  Psychiatric: Cooperative  ED Course  Procedures (including critical care time) Labs Review Labs Reviewed  BASIC METABOLIC PANEL - Abnormal; Notable for the following:    Potassium 3.3 (*)    All other components within normal limits  TSH - Abnormal; Notable for the following:    TSH 5.263 (*)    All other components within normal limits  CBC  TROPONIN I  MAGNESIUM  T4, FREE  HCG, SERUM, QUALITATIVE  I-STAT TROPOININ, ED    Imaging Review Dg Chest 2 View  11/21/2015  CLINICAL DATA:  Acute onset of shortness of breath, congestion and cough. Initial encounter. EXAM: CHEST  2 VIEW COMPARISON:  Chest radiograph from 02/19/2013 FINDINGS: The lungs are well-aerated and clear. There is no evidence of focal opacification, pleural effusion or pneumothorax. The heart is normal in  size; the mediastinal contour is within normal limits. No acute osseous abnormalities are seen. Clips are noted within the right upper quadrant, reflecting prior cholecystectomy. IMPRESSION: No acute cardiopulmonary process seen. Electronically Signed   By: Roanna Raider M.D.   On: 11/21/2015 20:58   I have personally reviewed and evaluated these images and lab results as part of my medical decision-making.   EKG Interpretation   Date/Time:  Sunday November 21 2015 20:21:49 EST Ventricular Rate:  91 PR Interval:  144 QRS Duration: 86 QT Interval:  376 QTC Calculation: 462 R Axis:   41 Text Interpretation:  Normal sinus rhythm Cannot rule out Anterior infarct  , age undetermined Abnormal ECG No significant change since last tracing  Confirmed by Issa Kosmicki MD, Mackinley Kiehn [phone]) on 11/21/2015 10:19:56 PM      MDM   Final diagnoses:  Palpitations  PVC's (premature ventricular contractions)  In short this is a 44 year old female who presents with palpitations. He is well-appearing and in no acute distress on presentation. Currently asymptomatic. Has an unremarkable physical exam and non-concerning cardiopulmonary exam. Has normal vital signs, without evidence of tachycardia here. EKG shows normal sinus rhythm, without stigmata of arrhythmia and no evidence of acute ischemia. On cardiac monitor, did occasionally have episode of PVC.She has mild hypokalemia at 3.3, and is given an additional 40 mEq of potassium. States that her doctors would like her potassium to be around 4.0. No evidence of other electrolyte derangements including normal magnesium. PERC negative and ruled out for PE. She has a negative chest x-ray. Unlikely ACS and has heart score of 1. Serial troponins negative and felt adequately ruled out for ACS.  Likely hypokalemia as etiology of symptoms, given that she states she is very sensitive to low potassium levels. At this time she was felt comfortable for discharge home. We'll continue her  potassium supplementation at home, with close outpatient follow-up this week with her PCP for a potassium recheck. Discussed further follow-up with PCP as outpatient and possible holter monitoring as needed. Strict return and follow-up instructions reviewed. She expressed understanding of all discharge instructions and felt comfortable with the plan of care.      Lavera Guise, MD 11/22/15 [phone]

## 2015-11-22 NOTE — Discharge Instructions (Signed)
Please continue to take your potassium supplementation as prescribed. Your potassium was low today, which may contribute to your palpitations. Please follow-up with your primary care physician this week for potassium recheck. Please return without fail for worsening symptoms including difficulty breathing, severe chest pain, passing out or feeling like you are about to pass out frequently, or any other symptoms concerning to you.  Palpitations A palpitation is the feeling that your heartbeat is irregular. It may feel like your heart is fluttering or skipping a beat. It may also feel like your heart is beating faster than normal. This is usually not a serious problem. In some cases, you may need more medical tests. HOME CARE  Avoid:  Caffeine in coffee, tea, soft drinks, diet pills, and energy drinks.  Chocolate.  Alcohol.  Stop smoking if you smoke.  Reduce your stress and anxiety. Try:  A method that measures bodily functions so you can learn to control them (biofeedback).  Yoga.  Meditation.  Physical activity such as swimming, jogging, or walking.  Get plenty of rest and sleep. GET HELP IF:  Your fast or irregular heartbeat continues after 24 hours.  Your palpitations occur more often. GET HELP RIGHT AWAY IF:   You have chest pain.  You feel short of breath.  You have a very bad headache.  You feel dizzy or pass out (faint). MAKE SURE YOU:   Understand these instructions.  Will watch your condition.  Will get help right away if you are not doing well or get worse.   This information is not intended to replace advice given to you by your health care provider. Make sure you discuss any questions you have with your health care provider.   Document Released: 09/19/2008 Document Revised: 01/01/2015 Document Reviewed: 02/09/2012 Elsevier Interactive Patient Education 2016 [employer]  Holter Monitoring A Holter monitor is a small device that is used to detect  abnormal heart rhythms. It clips to your clothing and is connected by wires to flat, sticky disks (electrodes) that attach to your chest. It is worn continuously for 24-48 hours. HOME CARE INSTRUCTIONS  Wear your Holter monitor at all times, even while exercising and sleeping, for as long as directed by your health care provider.  Make sure that the Holter monitor is safely clipped to your clothing or close to your body as recommended by your health care provider.  Do not get the monitor or wires wet.  Do not put body lotion or moisturizer on your chest.  Keep your skin clean.  Keep a diary of your daily activities, such as walking and doing chores. If you feel that your heartbeat is abnormal or that your heart is fluttering or skipping a beat:  Record what you are doing when it happens.  Record what time of day the symptoms occur.  Return your Holter monitor as directed by your health care provider.  Keep all follow-up visits as directed by your health care provider. This is important. SEEK IMMEDIATE MEDICAL CARE IF:  You feel lightheaded or you faint.  You have trouble breathing.  You feel pain in your chest, upper arm, or jaw.  You feel sick to your stomach and your skin is pale, cool, or damp.  You heartbeat feels unusual or abnormal.   This information is not intended to replace advice given to you by your health care provider. Make sure you discuss any questions you have with your health care provider.   Document Released: 09/08/2004 Document Revised: 01/01/2015  Document Reviewed: 07/20/2014 Elsevier Interactive Patient Education [employer].

## 2016-01-27 ENCOUNTER — Encounter: Payer: Self-pay | Admitting: Cardiology

## 2016-01-27 ENCOUNTER — Ambulatory Visit (INDEPENDENT_AMBULATORY_CARE_PROVIDER_SITE_OTHER): Payer: BC Managed Care – PPO | Admitting: Cardiology

## 2016-01-27 VITALS — BP 120/72 | HR 87 | Ht 64.0 in | Wt 211.4 lb

## 2016-01-27 DIAGNOSIS — I471 Supraventricular tachycardia: Secondary | ICD-10-CM | POA: Diagnosis not present

## 2016-01-27 NOTE — Progress Notes (Signed)
Electrophysiology Office Note   Date:  01/27/2016   ID:  Michelle Miles, DOB [birth year], MRN [id number]  PCP:  Marylen Ponto, Michelle Miles  Primary Electrophysiologist:  Regan Lemming, Michelle Miles    No chief complaint on file.    History of Present Illness: Michelle Miles is a 45 y.o. female who presents today for electrophysiology evaluation.   In November 2016, she presented to the emergency room with palpitations. In the past she has had palpitations and chest fluttering in the setting of hypokalemia. 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 In November she had had intermittent fluttering for the past several days. She had mild chest tightness associated with the fluttering. She did not pass out. She had not had any recent illnesses and does not drink caffeinated beverages or energy drinks. She says that she gets the palpitations daily and feels flipping and flopping in her chest there is no exacerbating or alleviating factors that she is found. She has had subsequent fatigue. 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   Today, she denies symptoms of orthopnea, PND, lower extremity edema, claudication, dizziness, presyncope, syncope, bleeding, or neurologic sequela. The patient is tolerating medications without difficulties and is otherwise without complaint today.    Past Medical History  Diagnosis Date  . Hypokalemia   . Sinus infection     02/18/12 states resolved  . Non-smoker   . Non-smoker   . PONV (postoperative nausea and vomiting)     also "slow to wake"  . Meniere's disease     trated with maxide   Past Surgical History  Procedure Laterality Date  . Cholecystectomy      10 yrs ago  . Foot surgery      right  . Rhinoplasty      for deviated septum  7 yrs ago  . Ablation       lining of the uterus  . Colonoscopy N/A 02/03/2013    Procedure: COLONOSCOPY;  Surgeon: Barrie Folk, Michelle Miles;  Location: [facility] ENDOSCOPY;  Service: Endoscopy;  Laterality: N/A;  . Carpal tunnel release Right   . Hemorrhoid surgery N/A 02/19/2013    Procedure: PROCEDURE PROLAPSED HEMORRHOIDS;  Surgeon: Mariella Saa, Michelle Miles;  Location: [facility] ORS;  Service: General;  Laterality: N/A;     Current Outpatient Prescriptions  Medication Sig Dispense Refill  . Ascorbic Acid (VITAMIN C PO) Take 1 tablet by mouth daily.    Marland Kitchen escitalopram (LEXAPRO) 5 MG tablet Take 5 mg by mouth daily.    . fluticasone (FLONASE) 50 MCG/ACT nasal spray Place 2 sprays into the nose daily. 16 g 2  . hydrocortisone (ANUSOL-HC) 25 MG suppository Place 1 suppository (25 mg total) rectally 2 (two) times daily. 42 suppository 0  . ibuprofen (ADVIL,MOTRIN) 200 MG tablet Take 400 mg by mouth every 6 (six) hours as needed for moderate pain.    Marland Kitchen loratadine (CLARITIN) 10 MG tablet Take 10 mg by mouth daily.    Marland Kitchen oxyCODONE-acetaminophen (ROXICET) 5-325 MG per tablet Take 1-2 tablets by mouth every 4 (four) hours as needed for pain. 40 tablet 0  . polyethylene glycol (MIRALAX / GLYCOLAX) packet Take 17 g by mouth daily.    . potassium chloride SA (K-DUR,KLOR-CON) 20 MEQ tablet Take 1 tablet (20 mEq total) by mouth 2 (two) times daily. (Patient taking differently: Take 40 mEq by mouth See admin instructions. Takes  once daily, then can take 1-2 extra tablets as needed, depending on how patient is feeling)    . Probiotic Product (PROBIOTIC DAILY PO) Take 1 tablet by mouth daily.    Marland Kitchen triamterene-hydrochlorothiazide (MAXZIDE-25) 37.5-25 MG per tablet Take 1 each (1 tablet total) by mouth daily.    Marland Kitchen VITAMIN E PO Take 1 tablet by mouth daily.     No current facility-administered medications for this visit.    Allergies:   Caffeine; Codeine; Floxin; and Prednisone   Social History:  The patient  reports that she has never smoked. She has never used smokeless tobacco. She reports that she does not drink alcohol or use illicit drugs.     Family History:  The patient's family history includes Cancer in her maternal grandmother; Stroke in her father.    ROS:  Please see the history of present illness.   Otherwise, review of systems is positive for sweating, fatigue, fever, chest pain, palptiations, cough, DOE, abdominal pain, dizziness, near syncope.   All other systems are reviewed and negative.    PHYSICAL EXAM: VS:  There were no vitals taken for this visit. , BMI There is no weight on file to calculate BMI. GEN: Well nourished, well developed, in no acute distress HEENT: normal Neck: no JVD, carotid bruits, or masses Cardiac: RRR; no murmurs, rubs, or gallops,no edema  Respiratory:  clear to auscultation bilaterally, normal work of breathing GI: soft, nontender, nondistended, + BS MS: no deformity or atrophy Skin: warm and dry Neuro:  Strength and sensation are intact Psych: euthymic mood, full affect  EKG:  EKG is ordered today. The ekg ordered today shows sinus rhythm, prolonged QTc 493, rate 87  Recent Labs: 11/21/2015: BUN 9; Creatinine, Ser 0.86; Hemoglobin 14.8; Magnesium 2.2; Platelets 257; Potassium 3.3*; Sodium 137; TSH 5.263*    Lipid Panel  No results found for: CHOL, TRIG, HDL, CHOLHDL, VLDL, LDLCALC, LDLDIRECT   Wt Readings from Last 3 Encounters:  11/21/15 202 lb (91.627 kg)  03/14/13 178 lb (80.74 kg)  02/17/13 168 lb (76.204 kg)   ASSESSMENT AND PLAN:  1.  Palpitations: At this point it is unclear as to the cause of her palpitations. She presents to the emergency room with palpitations, but the EKGs from that visit did not show any arrhythmia. She was in sinus rhythm at that time. She has worn a monitor in the past and says that her primary doctor has done the monitor. She also has a cardiologist who apparently has the results of further testing. We Michelle Miles try and get in touch with her primary cardiologist to obtain the results of the monitor and the other tests that have been done. We Michelle Miles  call her back with the results and a plan of care at that time.  Current medicines are reviewed at length with the patient today.   The patient has concerns regarding her medicines.  The following changes were made today:  none  Labs/ tests ordered today include:  No orders of the defined types were placed in this encounter.     Disposition:   FU with Michelle Miles 6 weeks  Signed, Michelle Graciano Jorja Loa, Michelle Miles  01/27/2016 1:37 PM     [facility] [address] [place] [address] [phone] (office) [phone] (fax)

## 2016-01-31 ENCOUNTER — Telehealth: Payer: Self-pay | Admitting: Cardiology

## 2016-01-31 NOTE — Telephone Encounter (Signed)
Informed medical records that we would like the complete monitor report. She will fax to [place] today.

## 2016-01-31 NOTE — Telephone Encounter (Signed)
New message     Calling back to see what all information you will need on patients. - heart monitor

## 2016-02-02 ENCOUNTER — Telehealth: Payer: Self-pay | Admitting: Cardiology

## 2016-02-02 NOTE — Telephone Encounter (Signed)
Informed patient Dr. Elberta Fortis reviewed records - recommends SVT ablation Patient is agreeable to 3/7. I will arrange and call her back tomorrow to review instructions. Patient verbalized understanding and agreeable to plan.

## 2016-02-02 NOTE — Telephone Encounter (Signed)
Follow up     Calling to see if we have received records from [facility] health physician cardiology.  Please call and let him know yes or no

## 2016-02-02 NOTE — Telephone Encounter (Signed)
Follow up     Husband calling to speak with nurse regarding his wife.

## 2016-02-03 ENCOUNTER — Encounter: Payer: Self-pay | Admitting: *Deleted

## 2016-02-03 ENCOUNTER — Other Ambulatory Visit: Payer: Self-pay | Admitting: *Deleted

## 2016-02-03 DIAGNOSIS — I471 Supraventricular tachycardia: Secondary | ICD-10-CM

## 2016-02-03 DIAGNOSIS — Z01812 Encounter for preprocedural laboratory examination: Secondary | ICD-10-CM

## 2016-02-03 NOTE — Telephone Encounter (Signed)
Scheduled SVT ablation for 3/7. Letter of instructions reviewed with patient and mailed to home address. Pre procedure labs on 2/28. Post ablation f/u scheduled. Patient verbalized understanding and agreeable to plan.

## 2016-02-13 ENCOUNTER — Encounter (HOSPITAL_COMMUNITY): Payer: Self-pay | Admitting: Nurse Practitioner

## 2016-02-13 ENCOUNTER — Emergency Department (HOSPITAL_COMMUNITY)
Admission: EM | Admit: 2016-02-13 | Discharge: 2016-02-13 | Disposition: A | Discharge status: Home / Self Care | Payer: BC Managed Care – PPO | Attending: Physician Assistant | Admitting: Physician Assistant

## 2016-02-13 ENCOUNTER — Telehealth: Payer: Self-pay | Admitting: Nurse Practitioner

## 2016-02-13 DIAGNOSIS — Z8669 Personal history of other diseases of the nervous system and sense organs: Secondary | ICD-10-CM | POA: Diagnosis not present

## 2016-02-13 DIAGNOSIS — A084 Viral intestinal infection, unspecified: Secondary | ICD-10-CM | POA: Insufficient documentation

## 2016-02-13 DIAGNOSIS — Z8709 Personal history of other diseases of the respiratory system: Secondary | ICD-10-CM | POA: Insufficient documentation

## 2016-02-13 DIAGNOSIS — I471 Supraventricular tachycardia: Secondary | ICD-10-CM | POA: Diagnosis not present

## 2016-02-13 DIAGNOSIS — Z79899 Other long term (current) drug therapy: Secondary | ICD-10-CM | POA: Insufficient documentation

## 2016-02-13 DIAGNOSIS — E876 Hypokalemia: Secondary | ICD-10-CM | POA: Diagnosis not present

## 2016-02-13 DIAGNOSIS — Z792 Long term (current) use of antibiotics: Secondary | ICD-10-CM | POA: Diagnosis not present

## 2016-02-13 DIAGNOSIS — R112 Nausea with vomiting, unspecified: Secondary | ICD-10-CM | POA: Diagnosis present

## 2016-02-13 HISTORY — DX: Supraventricular tachycardia: I47.1

## 2016-02-13 HISTORY — DX: Supraventricular tachycardia, unspecified: I47.10

## 2016-02-13 LAB — COMPREHENSIVE METABOLIC PANEL
ALT: 25 U/L (ref 14–54)
AST: 26 U/L (ref 15–41)
Albumin: 3.5 g/dL (ref 3.5–5.0)
Alkaline Phosphatase: 50 U/L (ref 38–126)
Anion gap: 12 (ref 5–15)
BUN: 15 mg/dL (ref 6–20)
CHLORIDE: 104 mmol/L (ref 101–111)
CO2: 24 mmol/L (ref 22–32)
CREATININE: 0.81 mg/dL (ref 0.44–1.00)
Calcium: 8.5 mg/dL — ABNORMAL LOW (ref 8.9–10.3)
GFR calc non Af Amer: >60 mL/min (ref >60)
Glucose, Bld: 99 mg/dL (ref 65–99)
POTASSIUM: 3.7 mmol/L (ref 3.5–5.1)
SODIUM: 140 mmol/L (ref 135–145)
Total Bilirubin: 0.5 mg/dL (ref 0.3–1.2)
Total Protein: 7 g/dL (ref 6.5–8.1)

## 2016-02-13 LAB — URINALYSIS, ROUTINE W REFLEX MICROSCOPIC
Bilirubin Urine: NEGATIVE
GLUCOSE, UA: NEGATIVE mg/dL
HGB URINE DIPSTICK: NEGATIVE
Ketones, ur: NEGATIVE mg/dL
LEUKOCYTES UA: NEGATIVE
Nitrite: NEGATIVE
PROTEIN: NEGATIVE mg/dL
SPECIFIC GRAVITY, URINE: 1.03 (ref 1.005–1.030)
pH: 6.5 (ref 5.0–8.0)

## 2016-02-13 LAB — CBC
HEMATOCRIT: 45 % (ref 36.0–46.0)
Hemoglobin: 14.7 g/dL (ref 12.0–15.0)
MCH: 31.4 pg (ref 26.0–34.0)
MCHC: 32.7 g/dL (ref 30.0–36.0)
MCV: 96.2 fL (ref 78.0–100.0)
PLATELETS: 243 10*3/uL (ref 150–400)
RBC: 4.68 MIL/uL (ref 3.87–5.11)
RDW: 12.8 % (ref 11.5–15.5)
WBC: 8.4 10*3/uL (ref 4.0–10.5)

## 2016-02-13 MED ORDER — PROMETHAZINE HCL 25 MG PO TABS
12.5000 mg | ORAL_TABLET | Freq: Once | ORAL | Status: AC
  Administered 2016-02-13: 12.5 mg via ORAL
  Filled 2016-02-13: qty 1

## 2016-02-13 MED ORDER — ONDANSETRON 4 MG PO TBDP
4.0000 mg | ORAL_TABLET | Freq: Once | ORAL | Status: AC | PRN
  Administered 2016-02-13: 4 mg via ORAL

## 2016-02-13 MED ORDER — SODIUM CHLORIDE 0.9 % IV BOLUS (SEPSIS)
1000.0000 mL | Freq: Once | INTRAVENOUS | Status: AC
  Administered 2016-02-13: 1000 mL via INTRAVENOUS

## 2016-02-13 MED ORDER — ONDANSETRON 4 MG PO TBDP
ORAL_TABLET | ORAL | Status: AC
  Filled 2016-02-13: qty 1

## 2016-02-13 MED ORDER — PROMETHAZINE HCL 25 MG PO TABS: 25.0000 mg | ORAL_TABLET | Freq: Four times a day (QID) | ORAL | Status: DC | PRN

## 2016-02-13 NOTE — Discharge Instructions (Signed)
Please use medication as needed for nausea.  Please return with any concerns.  Viral Gastroenteritis Viral gastroenteritis is also known as stomach flu. This condition affects the stomach and intestinal tract. It can cause sudden diarrhea and vomiting. The illness typically lasts 3 to 8 days. Most people develop an immune response that eventually gets rid of the virus. While this natural response develops, the virus can make you quite ill. CAUSES  Many different viruses can cause gastroenteritis, such as rotavirus or noroviruses. You can catch one of these viruses by consuming contaminated food or water. You may also catch a virus by sharing utensils or other personal items with an infected person or by touching a contaminated surface. SYMPTOMS  The most common symptoms are diarrhea and vomiting. These problems can cause a severe loss of body fluids (dehydration) and a body salt (electrolyte) imbalance. Other symptoms may include:  Fever.  Headache.  Fatigue.  Abdominal pain. DIAGNOSIS  Your caregiver can usually diagnose viral gastroenteritis based on your symptoms and a physical exam. A stool sample may also be taken to test for the presence of viruses or other infections. TREATMENT  This illness typically goes away on its own. Treatments are aimed at rehydration. The most serious cases of viral gastroenteritis involve vomiting so severely that you are not able to keep fluids down. In these cases, fluids must be given through an intravenous line (IV). HOME CARE INSTRUCTIONS   Drink enough fluids to keep your urine clear or pale yellow. Drink small amounts of fluids frequently and increase the amounts as tolerated.  Ask your caregiver for specific rehydration instructions.  Avoid:  Foods high in sugar.  Alcohol.  Carbonated drinks.  Tobacco.  Juice.  Caffeine drinks.  Extremely hot or cold fluids.  Fatty, greasy foods.  Too much intake of anything at one time.  Dairy  products until 24 to 48 hours after diarrhea stops.  You may consume probiotics. Probiotics are active cultures of beneficial bacteria. They may lessen the amount and number of diarrheal stools in adults. Probiotics can be found in yogurt with active cultures and in supplements.  Wash your hands well to avoid spreading the virus.  Only take over-the-counter or prescription medicines for pain, discomfort, or fever as directed by your caregiver. Do not give aspirin to children. Antidiarrheal medicines are not recommended.  Ask your caregiver if you should continue to take your regular prescribed and over-the-counter medicines.  Keep all follow-up appointments as directed by your caregiver. SEEK IMMEDIATE MEDICAL CARE IF:   You are unable to keep fluids down.  You do not urinate at least once every 6 to 8 hours.  You develop shortness of breath.  You notice blood in your stool or vomit. This may look like coffee grounds.  You have abdominal pain that increases or is concentrated in one small area (localized).  You have persistent vomiting or diarrhea.  You have a fever.  The patient is a child younger than 3 months, and he or she has a fever.  The patient is a child older than 3 months, and he or she has a fever and persistent symptoms.  The patient is a child older than 3 months, and he or she has a fever and symptoms suddenly get worse.  The patient is a baby, and he or she has no tears when crying. MAKE SURE YOU:   Understand these instructions.  Will watch your condition.  Will get help right away if you are not  doing well or get worse.   This information is not intended to replace advice given to you by your health care provider. Make sure you discuss any questions you have with your health care provider.   Document Released: 12/11/2005 Document Revised: 03/04/2012 Document Reviewed: 09/27/2011 Elsevier Interactive Patient Education [employer]

## 2016-02-13 NOTE — ED Notes (Signed)
Pt will be d/c around 2030 if she doesn't have any more episodes of emesis.

## 2016-02-13 NOTE — Telephone Encounter (Signed)
   Pt called this AM to report that she's had N/V all night and in that setting has been having frequent palpitations and periods of tachycardia.  She was recently found to have SVT and is scheduled for a RFCA in March.  Historically, she has been most likely to experience tachycardia when hypokalemic and she is on potassium @ home but has not been able to keep down any of her meds (including flecainide).  I advised that if she continues to feel poorly, she will likely need to present to the ED (she lives closes to [place]) for eval, labs, IVF, and potentially IV cardiac meds.  Caller verbalized understanding and was grateful for the call back.  She will go to [facility] this AM.  Nicolasa Ducking, NP 02/13/2016, 9:22 AM

## 2016-02-13 NOTE — ED Notes (Signed)
Pt attempted to ambulate to room from lobby and became dizzy but insisted she continue to walk to the room.  Half way to the room she stopped and was exertionally SOB and dizzy.  Pt agreed to wheelchair.  NAD, A&O.

## 2016-02-13 NOTE — ED Notes (Signed)
Pt is in stable condition upon d/c and is escorted from ED via wheelchair. 

## 2016-02-13 NOTE — ED Notes (Addendum)
She c/o 1 day history of n/v, headaches, body aches, fevers. She denies bowel/bladder changes. She states she can not tolerate any oral intake, she has been trying to drink gatorade by can not keep it down. 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 She is concerned because she can not tolerate her heart medication or potassium since yesterday, she is taking flecainide currently for episodes of SVT while she is awaiting an ablation in march. She also c/o itchy red bumps to entire body intermittently over past week. She is A&Ox4, breathing easily

## 2016-02-13 NOTE — ED Provider Notes (Addendum)
CSN: [id number]     Arrival date & time 02/13/16  1311 History   First MD Initiated Contact with Patient 02/13/16 1719     Chief Complaint  Patient presents with  . Emesis     (Consider location/radiation/quality/duration/timing/severity/associated sxs/prior Treatment) HPI   She is a very pleasant 45 year old female with history of hypokalemia and A. fib. She is on flecainide at home. She is awaiting an ablation. 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 Today she started with nausea vomiting and diarrhea. This is consistent with significant going around her daycare. Patient denies any fever. Denies any abdominal pain.  She was unable to (which is why she came here to the hospital.  Patient has not had any fever. No urinary symptoms.  Past Medical History  Diagnosis Date  . Hypokalemia   . Sinus infection     02/18/12 states resolved  . Non-smoker   . Non-smoker   . PONV (postoperative nausea and vomiting)     also "slow to wake"  . Meniere's disease     trated with maxide  . SVT (supraventricular tachycardia) (HCC)   . Bronchitis    Past Surgical History  Procedure Laterality Date  . Cholecystectomy      10 yrs ago  . Foot surgery      right  . Rhinoplasty      for deviated septum  7 yrs ago  . Ablation       lining of the uterus  . Colonoscopy N/A 02/03/2013    Procedure: COLONOSCOPY;  Surgeon: Barrie Folk, MD;  Location: [facility] ENDOSCOPY;  Service: Endoscopy;  Laterality: N/A;  . Carpal tunnel release Right   . Hemorrhoid surgery N/A 02/19/2013    Procedure: PROCEDURE PROLAPSED HEMORRHOIDS;  Surgeon: Mariella Saa, MD;  Location: [facility] ORS;  Service: General;  Laterality: N/A;   Family History  Problem Relation Age of Onset  . Stroke Father   . Cancer Maternal Grandmother     breast   Social History  Substance Use Topics  . Smoking status: Never Smoker   . Smokeless tobacco: Never Used  . Alcohol Use: No   OB History    No  data available     Review of Systems  Constitutional: Negative for fever, activity change and fatigue.  HENT: Negative for congestion.   Respiratory: Negative for shortness of breath.   Cardiovascular: Negative for chest pain.  Gastrointestinal: Positive for nausea, vomiting and diarrhea. Negative for abdominal pain.  Genitourinary: Negative for dysuria and flank pain.  Musculoskeletal: Negative for back pain.  Neurological: Negative for headaches.  Psychiatric/Behavioral: Negative for agitation.      Allergies  Caffeine; Codeine; Floxin; and Prednisone  Home Medications   Prior to Admission medications   Medication Sig Start Date End Date Taking? Authorizing Provider  amoxicillin-clavulanate (AUGMENTIN XR) 1000-62.5 MG 12 hr tablet Take 2 tablets by mouth 2 (two) times daily.    Historical Provider, MD  Ascorbic Acid (VITAMIN C PO) Take 1 tablet by mouth daily.    Historical Provider, MD  escitalopram (LEXAPRO) 5 MG tablet Take 5 mg by mouth daily. 10/11/15   Historical Provider, MD  flecainide (TAMBOCOR) 50 MG tablet Take 50 mg by mouth 2 (two) times daily.    Historical Provider, MD  ibuprofen (ADVIL,MOTRIN) 200 MG tablet Take 400 mg by mouth every 6 (six) hours as needed for moderate pain.    Historical Provider, MD  loratadine (CLARITIN) 10 MG  tablet Take 10 mg by mouth daily.    Historical Provider, MD  polyethylene glycol (MIRALAX / GLYCOLAX) packet Take 17 g by mouth daily.    Historical Provider, MD  potassium chloride SA (K-DUR,KLOR-CON) 20 MEQ tablet Take 20 mEq by mouth 2 (two) times daily.    Historical Provider, MD  Probiotic Product (PROBIOTIC DAILY PO) Take 1 tablet by mouth daily.    Historical Provider, MD  promethazine (PHENERGAN) 25 MG tablet Take 1 tablet (25 mg total) by mouth every 6 (six) hours as needed for nausea or vomiting. 02/13/16   Courteney Lyn Mackuen, MD  triamterene-hydrochlorothiazide (MAXZIDE-25) 37.5-25 MG per tablet Take 1 each (1 tablet total)  by mouth daily. 02/03/13   Tora Kindred York, PA-C  VITAMIN E PO Take 1 tablet by mouth daily.    Historical Provider, MD   BP 112/68 mmHg  Pulse 78  Temp(Src) 98.6 F (37 C) (Oral)  Resp 17  SpO2 98% Physical Exam  Constitutional: She is oriented to person, place, and time. She appears well-developed and well-nourished.  HENT:  Head: Normocephalic and atraumatic.  Eyes: Conjunctivae are normal. Right eye exhibits no discharge.  Neck: Neck supple.  Cardiovascular: Normal rate, regular rhythm and normal heart sounds.   No murmur heard. Pulmonary/Chest: Effort normal and breath sounds normal. She has no wheezes. She has no rales.  Abdominal: Soft. She exhibits no distension. There is no tenderness.  Musculoskeletal: Normal range of motion. She exhibits no edema.  Neurological: She is oriented to person, place, and time. No cranial nerve deficit.  Skin: Skin is warm and dry. No rash noted. She is not diaphoretic.  Psychiatric: She has a normal mood and affect. Her behavior is normal.  Nursing note and vitals reviewed.   ED Course  Procedures (including critical care time) Labs Review Labs Reviewed  COMPREHENSIVE METABOLIC PANEL - Abnormal; Notable for the following:    Calcium 8.5 (*)    All other components within normal limits  CBC  URINALYSIS, ROUTINE W REFLEX MICROSCOPIC (NOT AT [facility])    Imaging Review No results found. I have personally reviewed and evaluated these images and lab results as part of my medical decision-making.   EKG Interpretation   Date/Time:  Sunday February 13 2016 18:26:42 EST Ventricular Rate:  82 PR Interval:  161 QRS Duration: 101 QT Interval:  434 QTC Calculation: 507 R Axis:   46 Text Interpretation:  Sinus rhythm Borderline T wave abnormalities  Borderline prolonged QT interval QT is 434 QTc 507 Confirmed by Corlis Leak,  COURTNEY ([id number]) on 02/13/2016 6:36:46 PM      MDM   Final diagnoses:  Viral gastroenteritis   Patient is a very  pleasant 45 year old female on flecainide for A. Fib presenting today with nausea vomiting diarrhea and inability to take her by mouth medications. Patient  Has no abdominal pain, not concern for intra-abdominal surgical process. Patient has sick exposure to viral gastroenteritis.  Patient's labs are normal, no hypokalemia, no evidence of dehydration.  We will reat with fluids, make sure patient is able to take her by mouth flecainide. Anticipate ability to discharge home given normal physical exam and vital signs at this time.  8:07 PM   Choose phenegran as antiemetic given mild QTC prolongation. Patient tolerated PO.    Courteney Randall An, MD 02/13/16 1808  Courteney Randall An, MD 02/13/16 2007

## 2016-02-15 NOTE — Telephone Encounter (Signed)
Instructed patient to stop her Flecainide 2 days prior to ablation. She will take her last dose on 3/4. She is agreeable to plan and verbalized understanding.

## 2016-02-21 ENCOUNTER — Telehealth: Payer: Self-pay | Admitting: Cardiology

## 2016-02-21 NOTE — Telephone Encounter (Signed)
New Message  Pt calling to speak w/ RN cocnerning upcoming appt; Please call back and discuss.

## 2016-02-21 NOTE — Telephone Encounter (Signed)
Patient asking about note to be out of work this week, if possible. 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 The school and patient have made arrangements for coverage following 1 week post ablation. School is concerned about patient getting sick/being exposed by being there this week.  Patient asking for note to be excused for this week to ensure making procedure scheduled for 3/7. She is aware I will review with Dr. Elberta Fortis (we are currently in clinic) and let her know.

## 2016-02-21 NOTE — Telephone Encounter (Signed)
Informed patient that we could not give her a note to miss work prior to the procedure, as there is no medical reason for her not to work. Patient understands.

## 2016-02-22 ENCOUNTER — Other Ambulatory Visit (INDEPENDENT_AMBULATORY_CARE_PROVIDER_SITE_OTHER): Payer: BC Managed Care – PPO

## 2016-02-22 DIAGNOSIS — Z01812 Encounter for preprocedural laboratory examination: Secondary | ICD-10-CM | POA: Diagnosis not present

## 2016-02-22 DIAGNOSIS — I471 Supraventricular tachycardia: Secondary | ICD-10-CM

## 2016-02-22 LAB — BASIC METABOLIC PANEL
BUN: 13 mg/dL (ref 7–25)
CALCIUM: 9.1 mg/dL (ref 8.6–10.2)
CO2: 30 mmol/L (ref 20–31)
Chloride: 100 mmol/L (ref 98–110)
Creat: 0.9 mg/dL (ref 0.50–1.10)
GLUCOSE: 86 mg/dL (ref 65–99)
POTASSIUM: 4.6 mmol/L (ref 3.5–5.3)
SODIUM: 137 mmol/L (ref 135–146)

## 2016-02-22 LAB — HCG, SERUM, QUALITATIVE: Preg, Serum: NEGATIVE

## 2016-02-22 LAB — CBC WITH DIFFERENTIAL/PLATELET
BASOS ABS: 0.1 10*3/uL (ref 0.0–0.1)
BASOS PCT: 1 % (ref 0–1)
EOS PCT: 1 % (ref 0–5)
Eosinophils Absolute: 0.1 10*3/uL (ref 0.0–0.7)
HCT: 44.2 % (ref 36.0–46.0)
Hemoglobin: 14.8 g/dL (ref 12.0–15.0)
LYMPHS PCT: 28 % (ref 12–46)
Lymphs Abs: 1.8 10*3/uL (ref 0.7–4.0)
MCH: 31.4 pg (ref 26.0–34.0)
MCHC: 33.5 g/dL (ref 30.0–36.0)
MCV: 93.6 fL (ref 78.0–100.0)
MONO ABS: 0.6 10*3/uL (ref 0.1–1.0)
MPV: 9.7 fL (ref 8.6–12.4)
Monocytes Relative: 10 % (ref 3–12)
NEUTROS ABS: 3.8 10*3/uL (ref 1.7–7.7)
Neutrophils Relative %: 60 % (ref 43–77)
Platelets: 310 10*3/uL (ref 150–400)
RBC: 4.72 MIL/uL (ref 3.87–5.11)
RDW: 13 % (ref 11.5–15.5)
WBC: 6.4 10*3/uL (ref 4.0–10.5)

## 2016-02-29 ENCOUNTER — Ambulatory Visit (HOSPITAL_COMMUNITY)
Admission: RE | Admit: 2016-02-29 | Discharge: 2016-03-01 | Disposition: A | Discharge status: Home / Self Care | Payer: BC Managed Care – PPO | Source: Ambulatory Visit | Attending: Cardiology | Admitting: Cardiology

## 2016-02-29 ENCOUNTER — Encounter: Payer: Self-pay | Admitting: Physician Assistant

## 2016-02-29 ENCOUNTER — Encounter (HOSPITAL_COMMUNITY): Payer: Self-pay | Admitting: General Practice

## 2016-02-29 ENCOUNTER — Encounter (HOSPITAL_COMMUNITY)
Admission: RE | Disposition: A | Discharge status: Home / Self Care | Payer: Self-pay | Source: Ambulatory Visit | Attending: Cardiology

## 2016-02-29 DIAGNOSIS — I471 Supraventricular tachycardia, unspecified: Secondary | ICD-10-CM | POA: Diagnosis present

## 2016-02-29 DIAGNOSIS — I4719 Other supraventricular tachycardia: Secondary | ICD-10-CM | POA: Insufficient documentation

## 2016-02-29 HISTORY — DX: Family history of other specified conditions: Z84.89

## 2016-02-29 HISTORY — PX: SUPRAVENTRICULAR TACHYCARDIA ABLATION: SHX6106

## 2016-02-29 HISTORY — DX: Chronic sinusitis, unspecified: J32.9

## 2016-02-29 HISTORY — DX: Unspecified chronic bronchitis: J42

## 2016-02-29 HISTORY — PX: ELECTROPHYSIOLOGIC STUDY: SHX172A

## 2016-02-29 LAB — PREGNANCY, URINE: Preg Test, Ur: NEGATIVE

## 2016-02-29 SURGERY — A-FLUTTER/A-TACH/SVT ABLATION

## 2016-02-29 MED ORDER — HEPARIN (PORCINE) IN NACL 2-0.9 UNIT/ML-% IJ SOLN
INTRAMUSCULAR | Status: AC
  Filled 2016-02-29: qty 500

## 2016-02-29 MED ORDER — HEPARIN (PORCINE) IN NACL 2-0.9 UNIT/ML-% IJ SOLN
INTRAMUSCULAR | Status: AC
  Filled 2016-02-29: qty 1000

## 2016-02-29 MED ORDER — ONDANSETRON HCL 4 MG/2ML IJ SOLN: 4.0000 mg | Freq: Four times a day (QID) | INTRAMUSCULAR | Status: DC | PRN

## 2016-02-29 MED ORDER — MIDAZOLAM HCL 5 MG/5ML IJ SOLN
INTRAMUSCULAR | Status: DC | PRN
  Administered 2016-02-29: 0.5 mg via INTRAVENOUS
  Administered 2016-02-29 (×2): 1 mg via INTRAVENOUS
  Administered 2016-02-29: 2 mg via INTRAVENOUS
  Administered 2016-02-29: 1 mg via INTRAVENOUS

## 2016-02-29 MED ORDER — BUPIVACAINE HCL (PF) 0.25 % IJ SOLN
INTRAMUSCULAR | Status: AC
  Filled 2016-02-29: qty 30

## 2016-02-29 MED ORDER — FENTANYL CITRATE (PF) 100 MCG/2ML IJ SOLN
INTRAMUSCULAR | Status: AC
  Filled 2016-02-29: qty 2

## 2016-02-29 MED ORDER — HYDROCODONE-ACETAMINOPHEN 5-325 MG PO TABS: 1.0000 | ORAL_TABLET | Freq: Once | ORAL | Status: DC

## 2016-02-29 MED ORDER — ADENOSINE 6 MG/2ML IV SOLN
INTRAVENOUS | Status: DC | PRN
  Administered 2016-02-29: 12 mg via INTRAVENOUS

## 2016-02-29 MED ORDER — ACETAMINOPHEN 325 MG PO TABS
650.0000 mg | ORAL_TABLET | ORAL | Status: DC | PRN
  Administered 2016-02-29 – 2016-03-01 (×3): 650 mg via ORAL
  Filled 2016-02-29 (×3): qty 2

## 2016-02-29 MED ORDER — SODIUM CHLORIDE 0.9% FLUSH: 3.0000 mL | INTRAVENOUS | Status: DC | PRN

## 2016-02-29 MED ORDER — BUPIVACAINE HCL (PF) 0.25 % IJ SOLN
INTRAMUSCULAR | Status: DC | PRN
  Administered 2016-02-29: 30 mL

## 2016-02-29 MED ORDER — ADENOSINE 6 MG/2ML IV SOLN
INTRAVENOUS | Status: AC
  Filled 2016-02-29: qty 4

## 2016-02-29 MED ORDER — SODIUM CHLORIDE 0.9% FLUSH
3.0000 mL | Freq: Two times a day (BID) | INTRAVENOUS | Status: DC
  Administered 2016-02-29: 3 mL via INTRAVENOUS

## 2016-02-29 MED ORDER — ISOPROTERENOL HCL 0.2 MG/ML IJ SOLN
INTRAMUSCULAR | Status: AC
  Filled 2016-02-29: qty 5

## 2016-02-29 MED ORDER — HEPARIN (PORCINE) IN NACL 2-0.9 UNIT/ML-% IJ SOLN
INTRAMUSCULAR | Status: DC | PRN
  Administered 2016-02-29: 500 mL

## 2016-02-29 MED ORDER — ISOPROTERENOL HCL 0.2 MG/ML IJ SOLN
1.0000 mg | INTRAVENOUS | Status: DC | PRN
  Administered 2016-02-29: 2 ug/min via INTRAVENOUS

## 2016-02-29 MED ORDER — SODIUM CHLORIDE 0.9 % IV SOLN: 250.0000 mL | INTRAVENOUS | Status: DC | PRN

## 2016-02-29 MED ORDER — MIDAZOLAM HCL 5 MG/5ML IJ SOLN
INTRAMUSCULAR | Status: AC
  Filled 2016-02-29: qty 5

## 2016-02-29 MED ORDER — FENTANYL CITRATE (PF) 100 MCG/2ML IJ SOLN
INTRAMUSCULAR | Status: DC | PRN
  Administered 2016-02-29 (×5): 25 ug via INTRAVENOUS

## 2016-02-29 SURGICAL SUPPLY — 12 items
BAG SNAP BAND KOVER 36X36 (MISCELLANEOUS) ×3 IMPLANT
CATH EZ STEER NAV 4MM D-F CUR (ABLATOR) ×2 IMPLANT
CATH JOSEPHSON QUAD-ALLRED 6FR (CATHETERS) ×4 IMPLANT
CATH WEBSTER BI DIR CS D-F CRV (CATHETERS) ×4 IMPLANT
PACK EP LATEX FREE (CUSTOM PROCEDURE TRAY) ×3
PACK EP LF (CUSTOM PROCEDURE TRAY) ×1 IMPLANT
PAD DEFIB LIFELINK (PAD) ×3 IMPLANT
PATCH CARTO3 (PAD) ×2 IMPLANT
SHEATH PINNACLE 6F 10CM (SHEATH) ×4 IMPLANT
SHEATH PINNACLE 7F 10CM (SHEATH) ×2 IMPLANT
SHEATH PINNACLE 8F 10CM (SHEATH) ×2 IMPLANT
SHIELD RADPAD SCOOP 12X17 (MISCELLANEOUS) ×3 IMPLANT

## 2016-02-29 NOTE — H&P (Signed)
History of Present Illness: Michelle Miles is a 45 y.o. female who presents today for electrophysiology evaluation.  In November 2016, she presented to the emergency room with palpitations. In the past she has had palpitations and chest fluttering in the setting of hypokalemia. 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 In November she had had intermittent fluttering since stopping her flecainide. She presents today for SVT ablation  Today, she denies symptoms of orthopnea, PND, lower extremity edema, claudication, dizziness, presyncope, syncope, bleeding, or neurologic sequela. The patient is tolerating medications without difficulties and is otherwise without complaint today.    Past Medical History  Diagnosis Date  . Hypokalemia   . Sinus infection     02/18/12 states resolved  . Non-smoker   . Non-smoker   . PONV (postoperative nausea and vomiting)     also "slow to wake"  . Meniere's disease     trated with maxide   Past Surgical History  Procedure Laterality Date  . Cholecystectomy      10 yrs ago  . Foot surgery      right  . Rhinoplasty      for deviated septum 7 yrs ago  . Ablation      lining of the uterus  . Colonoscopy N/A 02/03/2013    Procedure: COLONOSCOPY; Surgeon: Barrie FolkJohn C Hayes, MD; [facility]; Service: Endoscopy; Laterality: N/A;  . Carpal tunnel release Right   . Hemorrhoid surgery N/A 02/19/2013    Procedure: PROCEDURE PROLAPSED HEMORRHOIDS; Surgeon: Mariella SaaBenjamin T Hoxworth, MD; Location: [facility]; Service: General; Laterality: N/A;     Current Outpatient Prescriptions  Medication Sig Dispense Refill  . Ascorbic Acid (VITAMIN C PO) Take 1 tablet by mouth daily.    Marland Kitchen. escitalopram (LEXAPRO) 5 MG tablet Take 5 mg by mouth daily.    . fluticasone (FLONASE) 50 MCG/ACT nasal spray Place 2 sprays into the nose daily. 16 g 2  .  hydrocortisone (ANUSOL-HC) 25 MG suppository Place 1 suppository (25 mg total) rectally 2 (two) times daily. 42 suppository 0  . ibuprofen (ADVIL,MOTRIN) 200 MG tablet Take 400 mg by mouth every 6 (six) hours as needed for moderate pain.    Marland Kitchen. loratadine (CLARITIN) 10 MG tablet Take 10 mg by mouth daily.    Marland Kitchen. oxyCODONE-acetaminophen (ROXICET) 5-325 MG per tablet Take 1-2 tablets by mouth every 4 (four) hours as needed for pain. 40 tablet 0  . polyethylene glycol (MIRALAX / GLYCOLAX) packet Take 17 g by mouth daily.    . potassium chloride SA (K-DUR,KLOR-CON) 20 MEQ tablet Take 1 tablet (20 mEq total) by mouth 2 (two) times daily. (Patient taking differently: Take 40 mEq by mouth See admin instructions. Takes 40meq once daily, then can take 1-2 extra tablets as needed, depending on how patient is feeling)    . Probiotic Product (PROBIOTIC DAILY PO) Take 1 tablet by mouth daily.    Marland Kitchen. triamterene-hydrochlorothiazide (MAXZIDE-25) 37.5-25 MG per tablet Take 1 each (1 tablet total) by mouth daily.    Marland Kitchen. VITAMIN E PO Take 1 tablet by mouth daily.     No current facility-administered medications for this visit.    Allergies: Caffeine; Codeine; Floxin; and Prednisone   Social History: The patient  reports that she has never smoked. She has never used smokeless tobacco. She reports that she does not drink alcohol or use illicit drugs.   Family History: The patient's family history includes Cancer in her maternal grandmother; Stroke in her father.    ROS:  Please see the history of present illness. Otherwise, review of systems is positive for sweating, fatigue, fever, chest pain, palptiations, cough, DOE, abdominal pain, dizziness, near syncope. All other systems are reviewed and negative.    PHYSICAL EXAM: Filed Vitals:   02/29/16 0923  BP: 125/80  Pulse: 85  Temp: 98.2 F (36.8 C)  Resp: 18    GEN: Well nourished, well developed, in no  acute distress  HEENT: normal  Neck: no JVD, carotid bruits, or masses Cardiac: RRR; no murmurs, rubs, or gallops,no edema  Respiratory: clear to auscultation bilaterally, normal work of breathing GI: soft, nontender, nondistended, + BS MS: no deformity or atrophy  Skin: warm and dry Neuro: Strength and sensation are intact Psych: euthymic mood, full affect  Lipid Panel   Labs (Brief)    No results found for: CHOL, TRIG, HDL, CHOLHDL, VLDL, LDLCALC, LDLDIRECT     Wt Readings from Last 3 Encounters:  11/21/15 202 lb (91.627 kg)  03/14/13 178 lb (80.74 kg)  02/17/13 168 lb (76.204 kg)   ASSESSMENT AND PLAN:  1. Palpitations: Has had documented SVT on monitor.  Presents for SVT ablation.  Risks and benefits explained.  Risks include bleeding, tamponade, stroke, heart block, and damage to surrounding organs.  Patient understands the risks and has agreed to the procedure.   Signed, Tyera Hansley Jorja Loa, MD  01/27/2016 1:37 PM

## 2016-02-29 NOTE — Progress Notes (Signed)
Site area: left groin a 6 french venous sheath was removed X2  Site Prior to Removal:  Level 0  Pressure Applied For 15 MINUTES    Minutes Beginning at 1430p  Manual:   Yes.    Patient Status During Pull:  stable  Post Pull Groin Site:  Level 0  Post Pull Instructions Given:  Yes.    Post Pull Pulses Present:  Yes.    Dressing Applied:  Yes.    Comments:  VS remains stable during sheath pull.

## 2016-02-29 NOTE — Progress Notes (Signed)
Site area: Right groin a 7 and 8 french venous sheaths were removed  Site Prior to Removal:  Level 0  Pressure Applied For 15 MINUTES    Minutes Beginning at 1445p  Manual:   Yes.    Patient Status During Pull:  stable  Post Pull Groin Site:  Level 0  Post Pull Instructions Given:  Yes.    Post Pull Pulses Present:  Yes.    Dressing Applied:  Yes.    Comments:  VS remain stable during sheath pull.  Pt able to verbalize understanding of post care.

## 2016-02-29 NOTE — Discharge Instructions (Signed)
No driving for 4 days. No lifting over 5 lbs for 1 week. No vigorous or sexual activity for 1 week. You may return to work on 03/08/16. Keep procedure site clean & dry. If you notice increased pain, swelling, bleeding or pus, call/return!  You may shower, but no soaking baths/hot tubs/pools for 1 week.

## 2016-02-29 NOTE — Discharge Summary (Signed)
ELECTROPHYSIOLOGY PROCEDURE DISCHARGE SUMMARY    Patient ID: Michelle EvensJennifer T Alves,  MRN: [id number], DOB/AGE: 05-26-1971 45 y.o.  Admit date: 02/29/2016 Discharge date: 03/01/16  Primary Care Physician: Marylen PontoHOLT,LYNLEY S, MD Electrophysiologist: Dr. Elberta Fortisamnitz  Primary Discharge Diagnosis:  Atrial tachycardia  Secondary Discharge Diagnosis:  None  Allergies  Allergen Reactions  . Caffeine Other (See Comments)    Makes pt light headed  . Codeine Nausea And Vomiting  . Floxin [Ofloxacin] Other (See Comments)    Causes hallucinations  . Prednisone Rash     Procedures This Admission: 1.  Electrophysiology study and radiofrequency catheter ablation on 02/29/16 by Dr Elberta Fortisamnitz.  This study demonstrated:  CONCLUSIONS:  1. Sinus rhythm upon presentation.  2. The patient had atrial tachycardia from the anterior CS os  3. Successful radiofrequency ablation of tachycardia focus 4. No inducible arrhythmias following ablation.  5. No early apparent complications   Brief HPI: Michelle EvensJennifer T Mauch is a 45 y.o. female with a past medical history as outlined above.  She has had increasing tachypalpitations with documented SVT.  Risks, benefits, and alternatives to ablation were reviewed with the patient who wished to proceed.   Hospital Course:  The patient was admitted and underwent EPS/RFCA with details as outlined above. She was monitored on telemetry overnight which demonstrated SR.  Groin incisions were without complication.  She examined by Dr. Elberta Fortisamnitz and  considered stable for discharge to home.  Follow up Jamiesha Victoria be arranged in 4 weeks.  Wound care and restrictions were reviewed with the patient prior to discharge.   Physical Exam: Filed Vitals:   02/29/16 2200 02/29/16 2300 03/01/16 0524 03/01/16 0547  BP: 97/51 87/44 102/55   Pulse: 70 81 78   Temp:   97.7 F (36.5 C)   TempSrc:   Oral   Resp: 15 19 14 16   Height:      Weight:   200 lb 9.9 oz (91 kg)   SpO2: 97% 98% 98%     GEN-  The patient is well appearing, alert and oriented x 3 today.   HEENT: normocephalic, atraumatic; sclera clear, conjunctiva pink; hearing intact; oropharynx clear; neck supple, no JVP Lymph- no cervical lymphadenopathy Lungs- Clear to ausculation bilaterally, normal work of breathing.  No wheezes, rales, rhonchi Heart- Regular rate and rhythm, no murmurs, rubs or gallops, PMI not laterally displaced GI- soft, non-tender, non-distended, bowel sounds present, no hepatosplenomegaly Extremities- no clubbing, cyanosis, or edema; DP/PT/radial pulses 2+ bilaterally, b/l  groins without hematoma/bruit, nontender MS- no significant deformity or atrophy Skin- warm and dry, no rash or lesion Psych- euthymic mood, full affect Neuro- strength and sensation are intact   Discharge Vitals: Blood pressure 102/55, pulse 78, temperature 97.7 F (36.5 C), temperature source Oral, resp. rate 16, height 5\' 4"  (1.626 m), weight 200 lb 9.9 oz (91 kg), SpO2 98 %.    Labs:   Lab Results  Component Value Date   WBC 6.4 02/22/2016   HGB 14.8 02/22/2016   HCT 44.2 02/22/2016   MCV 93.6 02/22/2016   PLT 310 02/22/2016   No results for input(s): NA, K, CL, CO2, BUN, CREATININE, CALCIUM, PROT, BILITOT, ALKPHOS, ALT, AST, GLUCOSE in the last 168 hours.  Invalid input(s): LABALBU  Discharge Medications:    Medication List    STOP taking these medications        flecainide 50 MG tablet  Commonly known as:  TAMBOCOR     promethazine 25 MG tablet  Commonly known as:  PHENERGAN      TAKE these medications        escitalopram 5 MG tablet  Commonly known as:  LEXAPRO  Take 5 mg by mouth daily.     loratadine 10 MG tablet  Commonly known as:  CLARITIN  Take 10 mg by mouth daily.     meclizine 25 MG tablet  Commonly known as:  ANTIVERT  Take 25 mg by mouth daily as needed for dizziness.     polyethylene glycol packet  Commonly known as:  MIRALAX / GLYCOLAX  Take 17 g by mouth daily.     potassium  chloride SA 20 MEQ tablet  Commonly known as:  K-DUR,KLOR-CON  Take 20 mEq by mouth 2 (two) times daily.     PROBIOTIC DAILY PO  Take 1 tablet by mouth daily.     triamterene-hydrochlorothiazide 37.5-25 MG tablet  Commonly known as:  MAXZIDE-25  Take 1 each (1 tablet total) by mouth daily.     VITAMIN C PO  Take 1 tablet by mouth daily.     VITAMIN E PO  Take 1 tablet by mouth daily.        Disposition: Home Discharge Instructions    Diet - low sodium heart healthy    Complete by:  As directed      Increase activity slowly    Complete by:  As directed           Follow-up Information    Follow up with Sheilah Pigeon, PA-C On 03/29/2016.   Specialty:  Cardiology   Why:  2:00PM   Contact information:   [address] [place] [address] [phone]       Follow up with Faria Casella Jorja Loa, MD On 05/31/2016.   Specialty:  Cardiology   Why:  3:30PM   Contact information:   [address] [place] [address] [phone]       Duration of Discharge Encounter: Greater than 30 minutes including physician time.  SignedFrancis Dowse, PA-C 03/01/2016 9:08 AM  I have seen and examined this patient with Francis Dowse.  Agree with above, note added to reflect my findings.  On exam, regular rhythm, no murmurs, lungs clear.  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  Marvie Brevik plan to follow up in clinic for further evaluation.  Davon Abdelaziz M. Ladarian Bonczek MD 03/01/2016 9:23 AM

## 2016-03-01 ENCOUNTER — Encounter (HOSPITAL_COMMUNITY): Payer: Self-pay | Admitting: Cardiology

## 2016-03-01 DIAGNOSIS — I471 Supraventricular tachycardia: Secondary | ICD-10-CM | POA: Diagnosis not present

## 2016-03-01 NOTE — Care Management Note (Signed)
Case Management Note  Patient Details  Name: Randa EvensJennifer T Kilker MRN: [id number] Date of Birth: 01-Aug-1971  Subjective/Objective:   Patient from home, pta indep, no needs.                 Action/Plan:   Expected Discharge Date:                  Expected Discharge Plan:  Home/Self Care  In-House Referral:     Discharge planning Services  CM Consult  Post Acute Care Choice:    Choice offered to:     DME Arranged:    DME Agency:     HH Arranged:    HH Agency:     Status of Service:  Completed, signed off  Medicare Important Message Given:    Date Medicare IM Given:    Medicare IM give by:    Date Additional Medicare IM Given:    Additional Medicare Important Message give by:     If discussed at Long Length of Stay Meetings, dates discussed:    Additional Comments:  Leone Havenaylor, Sincere Liuzzi Clinton, RN 03/01/2016, 12:44 PM

## 2016-03-03 ENCOUNTER — Ambulatory Visit (HOSPITAL_COMMUNITY)
Admission: RE | Admit: 2016-03-03 | Discharge: 2016-03-03 | Disposition: A | Discharge status: Home / Self Care | Payer: BC Managed Care – PPO | Source: Ambulatory Visit | Attending: Cardiovascular Disease | Admitting: Cardiovascular Disease

## 2016-03-03 ENCOUNTER — Telehealth: Payer: Self-pay | Admitting: Cardiology

## 2016-03-03 DIAGNOSIS — R209 Unspecified disturbances of skin sensation: Secondary | ICD-10-CM

## 2016-03-03 DIAGNOSIS — Z8679 Personal history of other diseases of the circulatory system: Secondary | ICD-10-CM

## 2016-03-03 DIAGNOSIS — M79662 Pain in left lower leg: Secondary | ICD-10-CM

## 2016-03-03 DIAGNOSIS — Z9889 Other specified postprocedural states: Secondary | ICD-10-CM | POA: Diagnosis not present

## 2016-03-03 DIAGNOSIS — R208 Other disturbances of skin sensation: Secondary | ICD-10-CM | POA: Diagnosis not present

## 2016-03-03 DIAGNOSIS — I471 Supraventricular tachycardia, unspecified: Secondary | ICD-10-CM

## 2016-03-03 NOTE — Telephone Encounter (Signed)
General //New Message  Pt called states that both of her feet are extremely cold her left foot is ice cold. She is wearing thermal socks and her toes are really really cold. Request a call  Back to discuss if this is normal or heart related.

## 2016-03-03 NOTE — Telephone Encounter (Signed)
Reports bilateral feel cold.  States left foot colder than right. She tells me that her husband says that her feet don't feel cold to him, but she says they feel cold to her. Reports slight bleeding from left groin site yesterday morning. Also reports slight left leg calf pain, slightly warm - but she isn't sure if warmth is from being under blanket or if actually warm. Reviewed with Dr. Elberta Fortisamnitz - order received for patient to have LE venous dopplers to r/o DVT. Patient placed on [facility] schedule for study. Corine ShelterLuke Kilroy, [place] will address with pt if testing is positive and send pt to ED for treatment. Patient is very appreciative for helping with this.

## 2016-03-16 ENCOUNTER — Telehealth: Payer: Self-pay | Admitting: Cardiology

## 2016-03-16 DIAGNOSIS — M79605 Pain in left leg: Secondary | ICD-10-CM

## 2016-03-16 DIAGNOSIS — R1909 Other intra-abdominal and pelvic swelling, mass and lump: Secondary | ICD-10-CM

## 2016-03-16 NOTE — Telephone Encounter (Signed)
Advised "knot" is normal and not to be concerned.  We discussed what she should monitor for.  Informed that we can look at site at follow up visit in April.  She then tells me that she has been experiencing issues with her left leg. 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  She does have 1 1/2 hours a day that she is sitting for reading group - and this week when she stands up her "left leg gives out on me after standing for prolonged periods of time".  She has had to grab onto something when standing and then limp around until it subsides. She describes pain as "stabbing pain when stands or turns leg".  Feeling occurs in the back of her leg and hip. She reports foot still feels cool to her, but when touched (by her husband) it is not cool/cold.  She describes it as a "burning cold".  Reviewed with Dr. Elberta Fortisamnitz - order for:  Groin Pseudo US.

## 2016-03-16 NOTE — Telephone Encounter (Signed)
Informed patient of recommendations. She is aware office will call her to arrange this test.

## 2016-03-16 NOTE — Telephone Encounter (Signed)
Follow Up  Couple of questions; Pt has a question about her legs. Pt has knots in her legs from the ablation. Pt states that they developed a few days after the ablation and she wants to be sure that she has those still. Its been 2 weeks and a day. Please call.

## 2016-03-24 ENCOUNTER — Ambulatory Visit (HOSPITAL_COMMUNITY)
Admission: RE | Admit: 2016-03-24 | Discharge: 2016-03-24 | Disposition: A | Discharge status: Home / Self Care | Payer: BC Managed Care – PPO | Source: Ambulatory Visit | Attending: Cardiology | Admitting: Cardiology

## 2016-03-24 DIAGNOSIS — R1909 Other intra-abdominal and pelvic swelling, mass and lump: Secondary | ICD-10-CM

## 2016-03-24 DIAGNOSIS — M79605 Pain in left leg: Secondary | ICD-10-CM

## 2016-03-29 ENCOUNTER — Ambulatory Visit: Payer: BC Managed Care – PPO | Admitting: Physician Assistant

## 2016-03-30 ENCOUNTER — Encounter: Payer: Self-pay | Admitting: Physician Assistant

## 2016-03-30 ENCOUNTER — Ambulatory Visit (INDEPENDENT_AMBULATORY_CARE_PROVIDER_SITE_OTHER): Payer: BC Managed Care – PPO | Admitting: Physician Assistant

## 2016-03-30 VITALS — BP 122/74 | HR 93 | Ht 64.0 in | Wt 215.0 lb

## 2016-03-30 DIAGNOSIS — I471 Supraventricular tachycardia: Secondary | ICD-10-CM | POA: Diagnosis not present

## 2016-03-30 DIAGNOSIS — R52 Pain, unspecified: Secondary | ICD-10-CM

## 2016-03-30 DIAGNOSIS — R002 Palpitations: Secondary | ICD-10-CM

## 2016-03-30 NOTE — Progress Notes (Signed)
Cardiology Office Note Date:  03/30/2016  Patient ID:  Michelle Miles, DOB [birth year], MRN [id number] PCP:  Marylen PontoHOLT,LYNLEY S, MD  Electrophysiologist: Dr. Elberta Fortisamnitz    Chief Complaint: post ablation f/u  History of Present Illness: Michelle Miles is a 45 y.o. female with history of atrial tachycardia s/p ablation 02/29/16 by Dr. Elberta Fortisamnitz. She states she is feeling much better with better energy then before her ablation.  She suffered a GI illness associated with some N/V/D and with this had a feeling like her heart was fast but not the same racing.  Outside of tha though she has been woke twice with short episodes of fast heart beats that self resolved, and intermittently on most days on/off quick episodes of palpitations.  No near syncope or syncope.  No CP or SOB.  She continues to have pain at her L groin, she states she is up and around most of her days and has no pain with ambulation or at rest.  She gets pain after she has been sitting for an hour or so and when she first stands and starts to walk she has significant pain from her groin around to her hip, that gives her the sensation her leg will give out for a second, once she is walking though resolves.  She has not had any kind of numbness.   Past Medical History  Diagnosis Date  . Hypokalemia   . Chronic sinus infection     "used to get them all the time; recently had one after none in 3-4 years" (02/29/2016)  . Meniere's disease     trated with maxide  . SVT (supraventricular tachycardia) (HCC)   . Chronic bronchitis (HCC)   . PONV (postoperative nausea and vomiting)     also "slow to wake"  . Family history of adverse reaction to anesthesia     "daughter gets PONV & is hard to wake up"    Past Surgical History  Procedure Laterality Date  . Plantar fascia release Right 2000s    right  . Nasal septum surgery  ~ 2010  . Endometrial ablation    . Colonoscopy N/A 02/03/2013    Procedure: COLONOSCOPY;  Surgeon: Barrie FolkJohn C Hayes,  MD;  [facility];  Service: Endoscopy;  Laterality: N/A;  . Carpal tunnel release Right ~ 2008  . Hemorrhoid surgery N/A 02/19/2013    Procedure: PROCEDURE PROLAPSED HEMORRHOIDS;  Surgeon: Mariella SaaBenjamin T Hoxworth, MD;  Location: [facility];  Service: General;  Laterality: N/A;  . Supraventricular tachycardia ablation  02/29/2016  . Laparoscopic cholecystectomy  ~ 2007  . Dilation and curettage of uterus  1998    "1 wk after I had my son"  . Electrophysiologic study N/A 02/29/2016    Procedure: SVT Ablation;  Surgeon: Will Jorja LoaMartin Camnitz, MD;  Location: [facility];  Service: Cardiovascular;  Laterality: N/A;    Current Outpatient Prescriptions  Medication Sig Dispense Refill  . Ascorbic Acid (VITAMIN C PO) Take 1 tablet by mouth daily.    Marland Kitchen. escitalopram (LEXAPRO) 5 MG tablet Take 5 mg by mouth daily.    Marland Kitchen. loratadine (CLARITIN) 10 MG tablet Take 10 mg by mouth daily.    . meclizine (ANTIVERT) 25 MG tablet Take 25 mg by mouth daily as needed for dizziness.     . polyethylene glycol (MIRALAX / GLYCOLAX) packet Take 17 g by mouth daily.    . potassium chloride SA (K-DUR,KLOR-CON) 20 MEQ tablet Take 20 mEq by mouth 2 (two)  times daily.    . Probiotic Product (PROBIOTIC DAILY PO) Take 1 tablet by mouth daily.    Marland Kitchen triamterene-hydrochlorothiazide (MAXZIDE-25) 37.5-25 MG per tablet Take 1 each (1 tablet total) by mouth daily.    Marland Kitchen VITAMIN E PO Take 1 tablet by mouth daily.     No current facility-administered medications for this visit.    Allergies:   Caffeine; Codeine; Floxin; and Prednisone   Social History:  The patient  reports that she has never smoked. She has never used smokeless tobacco. She reports that she does not drink alcohol or use illicit drugs.   Family History:  The patient's family history includes Cancer in her maternal grandmother; Stroke in her father.  ROS:  Please see the history of present illness.  All other systems are reviewed and otherwise negative.    PHYSICAL EXAM:  VS:  BP 122/74 mmHg  Pulse 93  Ht  (1.626 m)  Wt 215 lb (97.523 kg)  BMI 36.89 kg/m2 BMI: Body mass index is 36.89 kg/(m^2). Well nourished, well developed, in no acute distress HEENT: normocephalic, atraumatic Neck: no JVD, carotid bruits or masses Cardiac:  normal S1, S2; RRR; no significant murmurs, no rubs, or gallops Lungs:  clear to auscultation bilaterally, no wheezing, rhonchi or rales Abd: soft, nontender MS: no deformity or atrophy Ext: no edema Skin: warm and dry, no rash Neuro:  No gross deficits appreciated Psych: euthymic mood, full affect  b/l procedure groins are soft, non-tender, no bruits, pedal pulses are 3+   EKG:  Done today and reviewed by myself and Dr. Elberta Fortis is SR  3/321/17 LLE vascular US Patent arteries and veins in left groin, without pseudoaneurysm or AV fistula formation and no evidence of obstruction   02/29/16: EPS/Ablation, Dr. Elberta Fortis CONCLUSIONS:  1. Sinus rhythm upon presentation.  2. The patient had atrial tachycardia from the anterior CS os  3. Successful radiofrequency ablation of tachycardia focus 4. No inducible arrhythmias following ablation.  5. No early apparent complications.     Recent Labs: 11/21/2015: Magnesium 2.2; TSH 5.263* 02/13/2016: ALT 25 02/22/2016: BUN 13; Creat 0.90; Hemoglobin 14.8; Platelets 310; Potassium 4.6; Sodium 137  No results found for requested labs within last 365 days.   CrCl cannot be calculated (Patient has no serum creatinine result on file.).   Wt Readings from Last 3 Encounters:  03/30/16 215 lb (97.523 kg)  03/01/16 200 lb 9.9 oz (91 kg)  01/27/16 211 lb 6.4 oz (95.89 kg)     Other studies reviewed: Additional studies/records reviewed today include: summarized above  ASSESSMENT AND PLAN:  1. Atrial tachycardia, s/p ablation     procedure sites exam is stable, she has post procedure L groin pain as described, examined by Dr. Elberta Fortis as well.  Negative vascular  US.  Likely to resolve with time.       Palpitations as noted  Disposition: She is having daily palpitations, will schedule a 48hour monitor, monitor her groin c/o with expectation that this will resolve, she will see Dr. Elberta Fortis in 4-5 weeks, sooner if needed.  Current medicines are reviewed at length with the patient today.  The patient did not have any concerns regarding medicines.  Judith Blonder, PA-C 03/30/2016 4:11 PM     [facility] [address] [place] [address] [phone] (office)  [phone] (fax)

## 2016-03-30 NOTE — Patient Instructions (Signed)
Medication Instructions:   Your physician recommends that you continue on your current medications as directed. Please refer to the Current Medication list given to you today.  If you need a refill on your cardiac medications before your next appointment, please call your pharmacy.  Labwork: NONE ORDER TODAY    Testing/Procedures:  Your physician has recommended that you wear a holter monitor. Holter monitors are medical devices that record the heart's electrical activity. Doctors most often use these monitors to diagnose arrhythmias. Arrhythmias are problems with the speed or rhythm of the heartbeat. The monitor is a small, portable device. You can wear one while you do your normal daily activities. This is usually used to diagnose what is causing palpitations/syncope (passing out).     Follow-Up:  IN ONE MONTH WITH DR CAMNITZ   Any Other Special Instructions Will Be Listed Below (If Applicable).

## 2016-04-06 ENCOUNTER — Ambulatory Visit (INDEPENDENT_AMBULATORY_CARE_PROVIDER_SITE_OTHER): Payer: BC Managed Care – PPO

## 2016-04-06 DIAGNOSIS — R002 Palpitations: Secondary | ICD-10-CM | POA: Diagnosis not present

## 2016-04-20 ENCOUNTER — Telehealth: Payer: Self-pay | Admitting: Cardiology

## 2016-04-20 NOTE — Telephone Encounter (Signed)
Reviewed results with patient. She already has scheduled f/u on 5/8 to discuss this with Dr. Elberta Fortisamnitz

## 2016-04-20 NOTE — Telephone Encounter (Signed)
New Message:  Pt says she is returning a call from today  in regards to holter results. Please f/u with her

## 2016-04-20 NOTE — Telephone Encounter (Signed)
Follow Up   Michelle CarnesJennifer Miles is returning your call

## 2016-05-01 ENCOUNTER — Ambulatory Visit (INDEPENDENT_AMBULATORY_CARE_PROVIDER_SITE_OTHER): Payer: BC Managed Care – PPO | Admitting: Cardiology

## 2016-05-01 ENCOUNTER — Encounter: Payer: Self-pay | Admitting: Cardiology

## 2016-05-01 VITALS — BP 136/84 | HR 86 | Ht 64.0 in | Wt 219.0 lb

## 2016-05-01 DIAGNOSIS — I471 Supraventricular tachycardia: Secondary | ICD-10-CM

## 2016-05-01 DIAGNOSIS — R002 Palpitations: Secondary | ICD-10-CM | POA: Diagnosis not present

## 2016-05-01 NOTE — Progress Notes (Signed)
Electrophysiology Office Note   Date:  05/01/2016   ID:  Michelle Miles, DOB August 28, 1971, MRN [id number]  PCP:  Marylen Ponto, MD  Primary Electrophysiologist:  Will Jorja Loa, MD    Chief Complaint  Patient presents with  . Follow-up    1 month     History of Present Illness: Michelle Miles is a 45 y.o. female who presents today for electrophysiology evaluation.   In November 2016, she presented to the emergency room with palpitations. In the past she has had palpitations and chest fluttering in the setting of hypokalemia. 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 In November she had had intermittent fluttering for the past several days. She had mild chest tightness associated with the fluttering. She wore a Holter monitor which did not show evidence of SVT. Since that time, she has continued to have episodes of a forceful heartbeat. She says that it happens mainly when she is hot. It also happens when she is feeling a lot of stress.  Today, she denies symptoms of orthopnea, PND, lower extremity edema, claudication, dizziness, presyncope, syncope, bleeding, or neurologic sequela. The patient is tolerating medications without difficulties and is otherwise without complaint today.    Past Medical History  Diagnosis Date  . Hypokalemia   . Chronic sinus infection     "used to get them all the time; recently had one after none in 3-4 years" (02/29/2016)  . Meniere's disease     trated with maxide  . SVT (supraventricular tachycardia) (HCC)   . Chronic bronchitis (HCC)   . PONV (postoperative nausea and vomiting)     also "slow to wake"  . Family history of adverse reaction to anesthesia     "daughter gets PONV & is hard to wake up"   Past Surgical History  Procedure Laterality Date  . Plantar fascia release Right 2000s    right  . Nasal septum surgery  ~ 2010  . Endometrial ablation    . Colonoscopy N/A 02/03/2013    Procedure: COLONOSCOPY;  Surgeon:  Barrie Folk, MD;  Location: [facility] ENDOSCOPY;  Service: Endoscopy;  Laterality: N/A;  . Carpal tunnel release Right ~ 2008  . Hemorrhoid surgery N/A 02/19/2013    Procedure: PROCEDURE PROLAPSED HEMORRHOIDS;  Surgeon: Mariella Saa, MD;  Location: [facility] ORS;  Service: General;  Laterality: N/A;  . Supraventricular tachycardia ablation  02/29/2016  . Laparoscopic cholecystectomy  ~ 2007  . Dilation and curettage of uterus  1998    "1 wk after I had my son"  . Electrophysiologic study N/A 02/29/2016    Procedure: SVT Ablation;  Surgeon: Will Jorja Loa, MD;  Location: [facility] INVASIVE CV LAB;  Service: Cardiovascular;  Laterality: N/A;     Current Outpatient Prescriptions  Medication Sig Dispense Refill  . Ascorbic Acid (VITAMIN C PO) Take 1 tablet by mouth daily.    Marland Kitchen escitalopram (LEXAPRO) 5 MG tablet Take 5 mg by mouth daily.    Marland Kitchen loratadine (CLARITIN) 10 MG tablet Take 10 mg by mouth daily.    . meclizine (ANTIVERT) 25 MG tablet Take 25 mg by mouth daily as needed for dizziness.     . polyethylene glycol (MIRALAX / GLYCOLAX) packet Take 17 g by mouth daily.    . potassium chloride SA (K-DUR,KLOR-CON) 20 MEQ tablet Take 20 mEq by mouth 2 (two) times daily.    . Probiotic Product (PROBIOTIC DAILY PO) Take 1 tablet by mouth daily.    Marland Kitchen triamterene-hydrochlorothiazide (MAXZIDE-25)  37.5-25 MG per tablet Take 1 each (1 tablet total) by mouth daily.    Marland Kitchen. VITAMIN E PO Take 1 tablet by mouth daily.     No current facility-administered medications for this visit.    Allergies:   Caffeine; Codeine; Floxin; and Prednisone   Social History:  The patient  reports that she has never smoked. She has never used smokeless tobacco. She reports that she does not drink alcohol or use illicit drugs.   Family History:  The patient's family history includes Cancer in her maternal grandmother; Stroke in her father.    ROS:  Please see the history of present illness.   Otherwise, review of systems is positive  for palpitations, leg pain, DOE, headaches   All other systems are reviewed and negative.    PHYSICAL EXAM: VS:  BP 136/84 mmHg  Pulse 86  Ht 5\' 4"  (1.626 m)  Wt 219 lb (99.338 kg)  BMI 37.57 kg/m2 , BMI Body mass index is 37.57 kg/(m^2). GEN: Well nourished, well developed, in no acute distress HEENT: normal Neck: no JVD, carotid bruits, or masses Cardiac: RRR; no murmurs, rubs, or gallops,no edema  Respiratory:  clear to auscultation bilaterally, normal work of breathing GI: soft, nontender, nondistended, + BS MS: no deformity or atrophy Skin: warm and dry Neuro:  Strength and sensation are intact Psych: euthymic mood, full affect  EKG:  EKG is ordered today. The ekg ordered today shows sinus rhythm, rate 86, QTc 478 Recent Labs: 11/21/2015: Magnesium 2.2; TSH 5.263* 02/13/2016: ALT 25 02/22/2016: BUN 13; Creat 0.90; Hemoglobin 14.8; Platelets 310; Potassium 4.6; Sodium 137    Lipid Panel  No results found for: CHOL, TRIG, HDL, CHOLHDL, VLDL, LDLCALC, LDLDIRECT   Wt Readings from Last 3 Encounters:  05/01/16 219 lb (99.338 kg)  03/30/16 215 lb (97.523 kg)  03/01/16 200 lb 9.9 oz (91 kg)   Holter Average HR: 84 Minimum HR: 59 Maximum HR: 126  Ventricular ectopy 3977 (1.7%) Supraventricular ectopy 1094 without runs Zero atrial fibrillation Primary rhythm sinus rhythm  Palpitations associated with sinus rhythm Flutters associated with sinus rhythm Driving/ felt weak associated with sinus rhythm, rate 106 Chest pain associated with sinus rhythm  ASSESSMENT AND PLAN:  1.  Palpitations: At this time, it is unclear why she is having palpitations. She did have the same symptoms when she wore the 30 day monitor, and therefore it is possible that it is due to anxiety. She did suspect that anxiety could have a component. I have told her that at this time there is no need to treat the palpitations. Of note, she has been having trouble losing weight and we will therefore  check a TSH today.  Current medicines are reviewed at length with the patient today.   The patient has concerns regarding her medicines.  The following changes were made today:  none  Labs/ tests ordered today include:  Orders Placed This Encounter  Procedures  . TSH  . EKG 12-Lead     Disposition:   FU with Will Camnitz 6 months  Signed, Will Jorja LoaMartin Camnitz, MD  05/01/2016 3:52 PM     [facility] [address] [place] [address] [phone] (office) [phone] (fax)

## 2016-05-01 NOTE — Patient Instructions (Signed)
Medication Instructions:  Your physician recommends that you continue on your current medications as directed. Please refer to the Current Medication list given to you today.  Labwork: Today: TSH  Testing/Procedures: None ordered  Follow-Up: Your physician wants you to follow-up in: 6 months with Dr. Elberta Fortisamnitz. You will receive a reminder letter in the mail two months in advance. If you don't receive a letter, please call our office to schedule the follow-up appointment.  If you need a refill on your cardiac medications before your next appointment, please call your pharmacy.  Thank you for choosing [facility]!!   Dory HornSherri Lizza Huffaker, RN [phone]

## 2016-05-02 LAB — TSH: TSH: 2.16 m[IU]/L

## 2016-05-04 ENCOUNTER — Encounter: Payer: Self-pay | Admitting: Cardiology

## 2016-05-04 NOTE — Telephone Encounter (Signed)
Follow Up  Pt returned call about test results.

## 2016-05-04 NOTE — Telephone Encounter (Signed)
This encounter was created in error - please disregard.

## 2016-05-05 ENCOUNTER — Telehealth: Payer: Self-pay | Admitting: Cardiology

## 2016-05-05 NOTE — Telephone Encounter (Signed)
New message  Pt called states that her hands are turning blue and she has blood vessels that are bursting on her hands and fingers. When she turns a certain way they are turning completely purple

## 2016-05-05 NOTE — Telephone Encounter (Signed)
Patient called to report that while at school yesterday, she had a burning sensation in one of her fingers. She looked at the finger and there was a popped blood vessel. She st she had these before and didn't worry about it. Today, though, she has multiple popped vessels on both of her hands and they burn. When she turns her hands over, she reports they turn purple.  Instructed patient to call PCP and see if they can make OV today. If not, go to Urgent Care for evaluation. Patient was grateful for assistance.

## 2016-05-31 ENCOUNTER — Ambulatory Visit: Payer: BC Managed Care – PPO | Admitting: Cardiology

## 2017-03-07 ENCOUNTER — Institutional Professional Consult (permissible substitution): Payer: BC Managed Care – PPO | Admitting: Internal Medicine

## 2017-04-06 ENCOUNTER — Encounter: Payer: Self-pay | Admitting: Internal Medicine

## 2017-04-06 ENCOUNTER — Ambulatory Visit (INDEPENDENT_AMBULATORY_CARE_PROVIDER_SITE_OTHER): Payer: BC Managed Care – PPO | Admitting: Internal Medicine

## 2017-04-06 ENCOUNTER — Other Ambulatory Visit (INDEPENDENT_AMBULATORY_CARE_PROVIDER_SITE_OTHER): Payer: BC Managed Care – PPO

## 2017-04-06 VITALS — BP 118/66 | HR 87 | Ht 64.0 in | Wt 210.0 lb

## 2017-04-06 DIAGNOSIS — J45991 Cough variant asthma: Secondary | ICD-10-CM | POA: Diagnosis not present

## 2017-04-06 LAB — CBC WITH DIFFERENTIAL/PLATELET
Basophils Absolute: 0.1 10*3/uL (ref 0.0–0.1)
Basophils Relative: 1.1 % (ref 0.0–3.0)
EOS PCT: 2 % (ref 0.0–5.0)
Eosinophils Absolute: 0.1 10*3/uL (ref 0.0–0.7)
HEMATOCRIT: 42.8 % (ref 36.0–46.0)
HEMOGLOBIN: 14.3 g/dL (ref 12.0–15.0)
LYMPHS ABS: 2.5 10*3/uL (ref 0.7–4.0)
Lymphocytes Relative: 33.4 % (ref 12.0–46.0)
MCHC: 33.5 g/dL (ref 30.0–36.0)
MCV: 95.1 fl (ref 78.0–100.0)
Monocytes Absolute: 0.7 10*3/uL (ref 0.1–1.0)
Monocytes Relative: 10.1 % (ref 3.0–12.0)
Neutro Abs: 3.9 10*3/uL (ref 1.4–7.7)
Neutrophils Relative %: 53.4 % (ref 43.0–77.0)
Platelets: 312 10*3/uL (ref 150.0–400.0)
RBC: 4.5 Mil/uL (ref 3.87–5.11)
RDW: 13 % (ref 11.5–15.5)
WBC: 7.4 10*3/uL (ref 4.0–10.5)

## 2017-04-06 MED ORDER — FAMOTIDINE 20 MG PO TABS
ORAL_TABLET | ORAL | 11 refills | Status: DC
Start: 2017-04-06 — End: 2017-06-01

## 2017-04-06 MED ORDER — METHYLPREDNISOLONE ACETATE 80 MG/ML IJ SUSP
120.0000 mg | Freq: Once | INTRAMUSCULAR | Status: AC
Start: 2017-04-06 — End: 2017-04-06
  Administered 2017-04-06: 120 mg via INTRAMUSCULAR

## 2017-04-06 MED ORDER — PANTOPRAZOLE SODIUM 40 MG PO TBEC: 40.0000 mg | DELAYED_RELEASE_TABLET | Freq: Every day | ORAL | 2 refills | Status: DC

## 2017-04-06 NOTE — Patient Instructions (Addendum)
Depomedrol 120 mg IM   Plan A = Automatic = symbicort 80 Take 2 puffs first thing in am and then another 2 puffs about 12 hours later and singulair (montelukast) 10 mg each pm   Pantoprazole (protonix) 40 mg   Take  30-60 min before first meal of the day and Pepcid (famotidine)  20 mg one @  bedtime until return to office - this is the best way to tell whether stomach acid is contributing to your problem.    Plan B = Backup for breathing Only use your albuterol as a rescue medication to be used if you can't catch your breath by resting or doing a relaxed purse lip breathing pattern.  - The less you use it, the better it will work when you need it. - Ok to use the inhaler up to 2 puffs  every 4 hours if you must but call for appointment if use goes up over your usual need - Don't leave home without it !!  (think of it like the spare tire for your car)   Plan B  = for drainage For drainage / throat tickle try take CHLORPHENIRAMINE  4 mg - take one every 4 hours as needed - available over the counter- may cause drowsiness so start with just a bedtime dose or two and see how you tolerate it before trying in daytime    Please see patient coordinator before you leave today  to schedule sinus CT   Please remember to go to the lab department downstairs in the basement  for your tests - we will call you with the results when they are available.     Please schedule a follow up office visit in 2 weeks, sooner if needed  with all medications /inhalers/ solutions in hand so we can verify exactly what you are taking. This includes all medications from all doctors and over the counters

## 2017-04-06 NOTE — Assessment & Plan Note (Signed)
Body mass index is 36.05 kg/m.  -  trending down / encouraged Lab Results  Component Value Date   TSH 2.16 05/01/2016     Contributing to gerd risk/ doe/reviewed the need and the process to achieve and maintain neg calorie balance > defer f/u primary care including intermittently monitoring thyroid status

## 2017-04-06 NOTE — Progress Notes (Addendum)
Subjective:     Patient ID: Michelle Miles, female   DOB: 1971-07-06,    MRN: [id number]  HPI  88 yowf born at [facility] but had return for fever x 2 weeks p nl birth wt  ? 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  04/06/2017 1st [facility] office visit/ Michelle Miles   Chief Complaint  Patient presents with  . Pulmonary Consult    Referred by Dr. Leonor Miles. Pt c/o "trouble with bronchitis for years"- she has had persitant cough since Feb 2018. Cough is occ prod with yellow sputum, sometimes with a "tinge of red". Cough is worse after exertion and also at night, sometimes waking her up.   Allergy testing in [place] sev years by [facility] dx multiple allergies including cats pollen dust mold > rx clariton  symbicort started Feb 14th 2018 and proair but not organized  Wakes up with both temples and down neck and worse as day goes  Depo helps 50%  Also with gag/vomit/ urinary incot/cough and drainage worse when lie down despite clariton   No obvious other patterns in day to day or daytime variability or assoc sob unless coughing or  mucus plugs or hemoptysis or cp or chest tightness, subjective wheeze or overt hb symptoms. No unusual exp hx or h/o childhood pna/ asthma or knowledge of premature birth.   Also denies any obvious fluctuation of symptoms with weather or environmental changes or other aggravating or alleviating factors except as outlined above   Current Medications, Allergies, Complete Past Medical History, Past Surgical History, Family History, and Social  History were reviewed in [employer] record.  ROS  The following are not active complaints unless bolded sore throat, dysphagia, dental problems, itching, sneezing,  nasal congestion or excess/ purulent secretions, ear ache,   fever, chills, sweats, unintended wt loss, classically pleuritic or exertional cp,  orthopnea pnd or leg swelling, presyncope, palpitations, abdominal pain, anorexia, nausea, vomiting, diarrhea  or change in bowel or bladder habits, change in stools or urine, dysuria,hematuria,  rash, arthralgias, visual complaints, headache, numbness, weakness or ataxia or problems with walking or coordination,  change in mood/affect or memory.           Review of Systems     Objective:   Physical Exam     Wt Readings from Last 3 Encounters:  04/06/17 210 lb (95.3 kg)  05/01/16 219 lb (99.3 kg)  03/30/16 215 lb (97.5 kg)    Vital signs reviewed - Note on arrival 02 sats  98% on RA     Gen:  amb talkative anxious wf nad with harsh barking cough   HEENT: nl dentition,  and oropharynx which is pristine. Nl external ear canals without cough reflex - moderate bilateral non-specific turbinate edema     NECK :  without JVD/Nodes/TM/ nl carotid upstrokes bilaterally   LUNGS: no acc muscle use,  Nl contour chest which is clear to A and P bilaterally without cough on insp or exp maneuvers   CV:  RRR  no s3 or  murmur or increase in P2, and no edema   ABD:  soft and nontender with nl inspiratory excursion in the supine position. No bruits or organomegaly appreciated, bowel sounds nl  MS:  Nl gait/ ext warm without deformities, calf tenderness, cyanosis or clubbing No obvious joint restrictions   SKIN: warm and dry without lesions    NEURO:  alert, approp, nl sensorium with  no motor or cerebellar deficits apparent.    CXR report reviewed from February 26 2017  No active dz  Labs ordered 04/06/2017   Allergy profile   Assessment:

## 2017-04-06 NOTE — Assessment & Plan Note (Addendum)
Allergy profile 04/06/2017 >  Eos 0. /  IgE  - Spirometry 04/06/2017  wnl  Nl curvature   - Sinus CT 04/06/2017 >>>  - 04/06/2017  After extensive coaching HFA effectiveness =    75% > resume symb 80 2bid and add singulair trial and max gerd rx   Very poorly controlled chronic/ refractory symptoms x  Over 10 years ? Etiology  DDX of  difficult airways management almost all start with A and  include Adherence, Ace Inhibitors, Acid Reflux, Active Sinus Disease, Alpha 1 Antitripsin deficiency, Anxiety masquerading as Airways dz,  ABPA,  Allergy(esp in young), Aspiration (esp in elderly), Adverse effects of meds,  Active smokers, A bunch of PE's (a small clot burden can't cause this syndrome unless there is already severe underlying pulm or vascular dz with poor reserve) plus two Bs  = Bronchiectasis and Beta blocker use..and one C= CHF   Adherence is always the initial "prime suspect" and is a multilayered concern that requires a "trust but verify" approach in every patient - starting with knowing how to use medications, especially inhalers, correctly, keeping up with refills and understanding the fundamental difference between maintenance and prns vs those medications only taken for a very short course and then stopped and not refilled.  - see hfa teaching - return with all meds in hand using a trust but verify approach to confirm accurate Medication  Reconciliation The principal here is that until we are certain that the  patients are doing what we've asked, it makes no sense to ask them to do more.    ? Acid (or non-acid) GERD > always difficult to exclude as up to 75% of pts in some series report no assoc GI/ Heartburn symptoms> rec max (24h)  acid suppression and diet restrictions/ reviewed and instructions given in writing.    ? Allergy > check profile/ add trial of singulair - for now continue just the low dose ICS since the higher doses of ics can aggravate UACS and just give depomderol 120 mg IM  now   ? Active sinus dz > sinus CT   ? Anxiety > usually at the bottom of this list of usual suspects but should be much higher on this pt's based on H and P    ? abpa > check IgE    Total time devoted to counseling  > 50 % of initial 60 min office visit:  review case with pt/ discussion of options/alternatives/ personally creating written customized instructions  in presence of pt  then going over those specific  Instructions directly with the pt including how to use all of the meds but in particular covering each new medication in detail and the difference between the maintenance= "automatic" meds and the prns using an action plan format for the latter (If this problem/symptom => do that organization reading Left to right).  Please see AVS from this visit for a full list of these instructions which I personally wrote for this pt and  are unique to this visit.

## 2017-04-06 NOTE — Addendum Note (Signed)
Addended by: Christen Butter on: 04/06/2017 10:47 AM   Modules accepted: Orders

## 2017-04-09 LAB — RESPIRATORY ALLERGY PROFILE REGION II ~~LOC~~
Allergen, C. Herbarum, M2: <0.1 kU/L
Allergen, Comm Silver Birch, t9: <0.1 kU/L
Allergen, Cottonwood, t14: <0.1 kU/L
Allergen, D pternoyssinus,d7: 0.17 kU/L — ABNORMAL HIGH
Allergen, Mulberry, t76: <0.1 kU/L
Allergen, P. notatum, m1: <0.1 kU/L
Bermuda Grass: <0.1 kU/L
Cockroach: <0.1 kU/L
Common Ragweed: <0.1 kU/L
D. farinae: 0.12 kU/L — ABNORMAL HIGH
Dog Dander: <0.1 kU/L
Elm IgE: <0.1 kU/L
IgE (Immunoglobulin E), Serum: 122 kU/L — ABNORMAL HIGH (ref <115)
Johnson Grass: <0.1 kU/L
Timothy Grass: <0.1 kU/L

## 2017-04-10 NOTE — Progress Notes (Signed)
Spoke with pt and notified of results per Dr. Wert. Pt verbalized understanding and denied any questions. 

## 2017-04-12 ENCOUNTER — Ambulatory Visit (INDEPENDENT_AMBULATORY_CARE_PROVIDER_SITE_OTHER)
Admission: RE | Admit: 2017-04-12 | Discharge: 2017-04-12 | Disposition: A | Discharge status: Home / Self Care | Payer: BC Managed Care – PPO | Source: Ambulatory Visit | Attending: Internal Medicine | Admitting: Internal Medicine

## 2017-04-12 DIAGNOSIS — J45991 Cough variant asthma: Secondary | ICD-10-CM

## 2017-04-13 NOTE — Progress Notes (Signed)
LMTCB

## 2017-04-16 ENCOUNTER — Telehealth: Payer: Self-pay | Admitting: Internal Medicine

## 2017-04-16 MED ORDER — AMOXICILLIN-POT CLAVULANATE 875-125 MG PO TABS: 1.0000 | ORAL_TABLET | Freq: Two times a day (BID) | ORAL | 0 refills | Status: DC

## 2017-04-16 NOTE — Progress Notes (Signed)
lmtcb

## 2017-04-16 NOTE — Telephone Encounter (Signed)
advil cold and sinus otc  Augmentin 875 mg take one pill twice daily  X 10 days - take at breakfast and supper with large glass of water.  It would help reduce the usual side effects (diarrhea and yeast infections) if you ate cultured yogurt at lunch.

## 2017-04-16 NOTE — Telephone Encounter (Signed)
Rx sent to preferred pharmacy. Pt is aware and voiced her understanding. Nothing further needed. 

## 2017-04-16 NOTE — Telephone Encounter (Signed)
MW  Please Advise-sick message   Spoke with pt about her CT scan, she wanted to let you know she feels like she is having a sinus infection. She is c/o lots of sinus pressure causing her headache,her teeth are aching, lots of nasal congestion,increase sob,lots of coughing, states the Singulair is not helping and has tried otc ibuprofen. She does have a f/u appt with you on 04/20/17 but wanted to know if something could be called in.    1. Nyoka Cowden, MD (Physician) at 04/06/2017 10:06 AM - Signed    Depomedrol 120 mg IM   Plan A = Automatic = symbicort 80 Take 2 puffs first thing in am and then another 2 puffs about 12 hours later and singulair (montelukast) 10 mg each pm   Pantoprazole (protonix) 40 mg   Take  30-60 min before first meal of the day and Pepcid (famotidine)  20 mg one @  bedtime until return to office - this is the best way to tell whether stomach acid is contributing to your problem.    Plan B = Backup for breathing Only use your albuterol as a rescue medication to be used if you can't catch your breath by resting or doing a relaxed purse lip breathing pattern.  - The less you use it, the better it will work when you need it. - Ok to use the inhaler up to 2 puffs  every 4 hours if you must but call for appointment if use goes up over your usual need - Don't leave home without it !!  (think of it like the spare tire for your car)   Plan B  = for drainage For drainage / throat tickle try take CHLORPHENIRAMINE  4 mg - take one every 4 hours as needed - available over the counter- may cause drowsiness so start with just a bedtime dose or two and see how you tolerate it before trying in daytime    Please see patient coordinator before you leave today  to schedule sinus CT   Please remember to go to the lab department downstairs in the basement  for your tests - we will call you with the results when they are available.     Please schedule a follow up office visit  in 2 weeks, sooner if needed  with all medications /inhalers/ solutions in hand so we can verify exactly what you are taking. This includes all medications from all doctors and over the counters          Allergies  Allergen Reactions  . Caffeine Other (See Comments)    Makes pt light headed  . Codeine Nausea And Vomiting  . Floxin [Ofloxacin] Other (See Comments)    Causes hallucinations  . Prednisone Rash

## 2017-04-18 ENCOUNTER — Telehealth: Payer: Self-pay | Admitting: Internal Medicine

## 2017-04-18 NOTE — Telephone Encounter (Signed)
Michelle B Wert, MD sent to [name] M [name], CMA        Call patient : Study is unremarkable, no change in recs   Spoke with pt and notified of results per Dr. Wert. Pt verbalized understanding and denied any questions.  

## 2017-04-20 ENCOUNTER — Encounter: Payer: Self-pay | Admitting: Internal Medicine

## 2017-04-20 ENCOUNTER — Ambulatory Visit (INDEPENDENT_AMBULATORY_CARE_PROVIDER_SITE_OTHER): Payer: BC Managed Care – PPO | Admitting: Internal Medicine

## 2017-04-20 VITALS — BP 126/74 | HR 86 | Ht 64.0 in | Wt 206.0 lb

## 2017-04-20 DIAGNOSIS — J45991 Cough variant asthma: Secondary | ICD-10-CM | POA: Diagnosis not present

## 2017-04-20 MED ORDER — MONTELUKAST SODIUM 10 MG PO TABS: 10.0000 mg | ORAL_TABLET | Freq: Every day | ORAL | 11 refills | Status: DC

## 2017-04-20 NOTE — Patient Instructions (Addendum)
No change in medications - keep refilling your medicines as they are except for the augmentin   Keep using lots of saline nasal sprays   Please schedule a follow up office visit in 6 weeks, call sooner if needed

## 2017-04-20 NOTE — Progress Notes (Signed)
Subjective:     Patient ID: Michelle Miles, female   DOB: 06-15-1971,    MRN: [id number]    Brief patient profile:  5 yowf born at [facility] but had return for fever x 2 weeks p nl birth wt  ? 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    History of Present Illness  04/06/2017 [facility] visit/ Michelle Miles   Chief Complaint  Patient presents with  . Pulmonary Consult    Referred by Dr. Leonor Liv. Pt c/o "trouble with bronchitis for years"- she has had persitant cough since Feb 2018. Cough is occ prod with yellow sputum, sometimes with a "tinge of red". Cough is worse after exertion and also at night, sometimes waking her up.   Allergy testing in [place] sev years by [facility] dx multiple allergies including cats pollen dust mold > rx clariton  symbicort started Feb 14th 2018 and proair but not organized  Wakes up with both temples and down neck and worse as day goes  Depo helps 50%  Also with gag/vomit/ urinary incot/cough and drainage worse when lie down despite clariton rec Depomedrol 120 mg IM  Plan A = Automatic = symbicort 80 Take 2 puffs first thing in am and then another 2 puffs about 12 hours later and singulair (montelukast) 10 mg each pm  Pantoprazole (protonix) 40 mg   Take  30-60 min before first meal of the day and Pepcid (famotidine)  20 mg one @  bedtime until return to office - this is the best way to tell whether stomach acid is contributing to your problem.   Plan B = Backup for breathing Only use your albuterol as a rescue medication   Plan B  = for drainage For drainage / throat tickle try take CHLORPHENIRAMINE  4 mg - take one every 4 hours as needed - available over the counter- may cause drowsiness so start with just a bedtime dose or two and see how you tolerate it before trying in daytime    schedule sinus CT > neg        04/20/2017  f/u ov/[name] re: atypical asthma/ maint on symb 80 2bid / singulair  Chief Complaint  Patient presents with  . Follow-up    2wk rov- pt cough has improved since last OV. pt reports of occ non prod cough with laughing or when hot & occ sob with exertion.    no saba in 3 days now   No obvious day to day or daytime variability or assoc excess/ purulent sputum or mucus plugs or hemoptysis or cp or chest tightness, subjective wheeze or overt sinus or hb symptoms. No unusual exp hx or h/o childhood pna/ asthma or knowledge of premature birth.  Sleeping ok without nocturnal  or early am exacerbation  of respiratory  c/o's or need for noct saba. Also denies any obvious fluctuation of symptoms with weather or environmental changes or other aggravating or alleviating factors except as outlined above   Current Medications, Allergies, Complete Past Medical History, Past Surgical History,  Family History, and Social History were reviewed in [employer] record.  ROS  The following are not active complaints unless bolded sore throat, dysphagia, dental problems, itching, sneezing,  nasal congestion or excess/ purulent secretions, ear ache,   fever, chills, sweats, unintended wt loss, classically pleuritic or exertional cp,  orthopnea pnd or leg swelling, presyncope, palpitations, abdominal pain, anorexia, nausea, vomiting, diarrhea  or change in bowel or bladder habits, change in stools or urine, dysuria,hematuria,  rash, arthralgias, visual complaints, headache, numbness, weakness or ataxia or problems with walking or coordination,  change in mood/affect or memory.                    Objective:   Physical Exam     04/20/2017        206   04/06/17 210 lb (95.3 kg)  05/01/16 219 lb (99.3 kg)  03/30/16 215 lb (97.5 kg)    Vital signs reviewed - Note on arrival 02 sats  96% on RA     Gen:  amb pleasant  wf nad    HEENT: nl dentition,  and oropharynx which is pristine. Nl external ear canals without cough reflex - moderate bilateral non-specific turbinate edema     NECK :  without JVD/Nodes/TM/ nl carotid upstrokes bilaterally   LUNGS: no acc muscle use,  Trace bilateral end exp wheeze/ cough   CV:  RRR  no s3 or murmur or increase in P2, and no edema   ABD:  soft and nontender with nl inspiratory excursion in the supine position. No bruits or organomegaly appreciated, bowel sounds nl  MS:  Nl gait/ ext warm without deformities, calf tenderness, cyanosis or clubbing No obvious joint restrictions   SKIN: warm and dry without lesions    NEURO:  alert, approp, nl sensorium with  no motor or cerebellar deficits apparent.         Assessment:

## 2017-04-21 NOTE — Assessment & Plan Note (Signed)
Body mass index is 35.36 kg/m.  -  trending down/ encouraged Lab Results  Component Value Date   TSH 2.16 05/01/2016     Contributing to gerd risk/ doe/reviewed the need and the process to achieve and maintain neg calorie balance > defer f/u primary care including intermittently monitoring thyroid status

## 2017-04-21 NOTE — Assessment & Plan Note (Signed)
-   Spirometry 04/06/2017  wnl  Nl curvature   - Allergy profile 04/06/2017 >  Eos 0.1 /  IgE 122 RAST POS  Dust only  - Sinus CT 04/12/2017 >  No evidence of maxillary or frontal sinus disease. - 04/06/2017   resume symb 80 2bid and add singulair trial and max gerd rx   - 04/20/2017  After extensive coaching HFA effectiveness =    75% from a baseline of 50 % so continue symb 80/singuair   She is clearly better and not really using her hfa @ max effectiveness so rec work on this and if not 100% better consider the higher strength of symb next ov though there is a risk of aggravating the upper airway component   Each maintenance medication was reviewed in detail including most importantly the difference between maintenance and as needed and under what circumstances the prns are to be used.  Please see AVS for specific  Instructions which are unique to this visit and I personally typed out  which were reviewed in detail in writing with the patient and a copy provided.

## 2017-06-01 ENCOUNTER — Ambulatory Visit (INDEPENDENT_AMBULATORY_CARE_PROVIDER_SITE_OTHER): Payer: BC Managed Care – PPO | Admitting: Internal Medicine

## 2017-06-01 ENCOUNTER — Encounter: Payer: Self-pay | Admitting: Internal Medicine

## 2017-06-01 VITALS — BP 116/70 | HR 80 | Ht 64.0 in | Wt 206.8 lb

## 2017-06-01 DIAGNOSIS — J45991 Cough variant asthma: Secondary | ICD-10-CM

## 2017-06-01 MED ORDER — FAMOTIDINE 20 MG PO TABS: ORAL_TABLET | ORAL | 11 refills | Status: DC

## 2017-06-01 MED ORDER — PANTOPRAZOLE SODIUM 40 MG PO TBEC
40.0000 mg | DELAYED_RELEASE_TABLET | Freq: Every day | ORAL | 11 refills | Status: DC
Start: 2017-06-01 — End: 2018-01-23

## 2017-06-01 MED ORDER — MONTELUKAST SODIUM 10 MG PO TABS: 10.0000 mg | ORAL_TABLET | Freq: Every day | ORAL | 11 refills | Status: DC

## 2017-06-01 NOTE — Assessment & Plan Note (Addendum)
-  Spirometry 04/06/2017  wnl  Nl curvature   - Allergy profile 04/06/2017 >  Eos 0.1 /  IgE 122 RAST POS  Dust only  - Sinus CT 04/12/2017 >  No evidence of maxillary or frontal sinus disease. - 04/06/2017   resume symb 80 2bid and add singulair trial and max gerd rx  - 04/20/2017    continue symb 80/singulair   - 06/01/2017  After extensive coaching HFA effectiveness =    75%   Despite suboptimal hfa,  All goals of chronic asthma control met including optimal function and elimination of symptoms with minimal need for rescue therapy.  Contingencies discussed in full including contacting this office immediately if not controlling the symptoms using the rule of two's.         Each maintenance medication was reviewed in detail including most importantly the difference between maintenance and as needed and under what circumstances the prns are to be used.  Please see AVS for specific  Instructions which are unique to this visit and I personally typed out  which were reviewed in detail in writing with the patient and a copy provided.

## 2017-06-01 NOTE — Assessment & Plan Note (Signed)
Body mass index is 35.5 kg/m.  -  Trending up slightly  Lab Results  Component Value Date   TSH 2.16 05/01/2016     Contributing to gerd risk/ doe/reviewed the need and the process to achieve and maintain neg calorie balance > defer f/u primary care including intermittently monitoring thyroid status

## 2017-06-01 NOTE — Patient Instructions (Signed)
Work on inhaler technique:  relax and gently blow all the way out then take a nice smooth deep breath back in, triggering the inhaler at same time you start breathing in.  Hold for up to 5 seconds if you can. Blow out thru nose. Rinse and gargle with water when done     Please schedule a follow up visit in 6  months but call sooner if needed  

## 2017-06-01 NOTE — Progress Notes (Signed)
Subjective:     Patient ID: Michelle Miles, female   DOB: [birth year],    MRN: [id number]    Brief patient profile:  [age] yowf born at [facility] but had return for fever x 2 weeks p nl birth wt  ? 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    History of Present Illness  04/06/2017 1st [facility] office visit/ [facility]   Chief Complaint  Patient presents with  . Pulmonary Consult    Referred by Dr. Leonor LivHolt. Pt c/o "trouble with bronchitis for years"- she has had persitant cough since Feb 2018. Cough is occ prod with yellow sputum, sometimes with a "tinge of red". Cough is worse after exertion and also at night, sometimes waking her up.   [place]ergy [place] in [place] sev years by [facility] dx multiple allergies including cats pollen dust mold > rx clariton  symbicort started Feb 14th 2018 and proair but not organized  Wakes up with both temples and down neck and worse as day goes  Depo helps 50%  Also with gag/vomit/ urinary incot/cough and drainage worse when lie down despite clariton rec Depomedrol 120 mg IM  Plan A = Automatic = symbicort 80 Take 2 puffs first thing in am and then another 2 puffs about 12 hours later and singulair (montelukast) 10 mg each pm  Pantoprazole (protonix) 40 mg   Take  30-60 min before first meal of the day and Pepcid (famotidine)  20 mg one @  bedtime until return to office - this is the best way to tell whether stomach acid is contributing to your problem.   Plan B = Backup for breathing Only use your albuterol as a rescue medication   Plan B  = for drainage For drainage / throat tickle try take CHLORPHENIRAMINE  4 mg - take one every 4 hours as needed - available over the counter- may cause drowsiness so start with just a bedtime dose or two and see how you tolerate it before trying in daytime    schedule sinus CT > neg      06/01/2017  f/u ov/[facility] re: atypical asthma/ maint on symb 80 bid/ singulair / maybe 3 x weekly saba if goes out in heat  Chief Complaint  Patient presents with  . Follow-up    cough variant asthma, pt is doing well no concerns today      Not limited by breathing from desired activities  Unless in heat  No obvious day to day or daytime variability or assoc excess/ purulent sputum or mucus plugs or hemoptysis or cp or chest tightness, subjective wheeze or overt sinus or hb symptoms. No unusual exp hx or h/o childhood pna/ asthma or knowledge of premature birth.  Sleeping ok without nocturnal  or early am exacerbation  of respiratory  c/o's or need for noct saba. Also denies any obvious fluctuation of symptoms with weather or environmental changes or other aggravating or alleviating factors except as outlined above   Current Medications, Allergies, Complete Past Medical History, Past Surgical History, Family History,  and Social History were reviewed in [employer] record.  ROS  The following are not active complaints unless bolded sore throat, dysphagia, dental problems, itching, sneezing,  nasal congestion or excess/ purulent secretions, ear ache,   fever, chills, sweats, unintended wt loss, classically pleuritic or exertional cp,  orthopnea pnd or leg swelling, presyncope, palpitations, abdominal pain, anorexia, nausea, vomiting, diarrhea  or change in bowel or bladder habits, change in stools or urine, dysuria,hematuria,  rash, arthralgias, visual complaints, headache, numbness, weakness or ataxia or problems with walking or coordination,  change in mood/affect or memory.                       Objective:   Physical Exam  06/01/2017           207  04/20/2017        206   04/06/17 210 lb (95.3 kg)  05/01/16 219 lb (99.3 kg)  03/30/16 215 lb (97.5 kg)    Vital signs reviewed - Note on arrival 02 sats  99% on RA     Gen:  amb pleasant  wf nad    HEENT: nl dentition,  and oropharynx which is pristine. Nl external ear canals without cough reflex - moderate bilateral non-specific turbinate edema     NECK :  without JVD/Nodes/TM/ nl carotid upstrokes bilaterally   LUNGS: no acc muscle use,  Completely clear to A and P s wheeze   CV:  RRR  no s3 or murmur or increase in P2, and no edema   ABD:  soft and nontender with nl inspiratory excursion in the supine position. No bruits or organomegaly appreciated, bowel sounds nl  MS:  Nl gait/ ext warm without deformities, calf tenderness, cyanosis or clubbing No obvious joint restrictions   SKIN: warm and dry without lesions    NEURO:  alert, approp, nl sensorium with  no motor or cerebellar deficits apparent.         Assessment:

## 2018-01-23 ENCOUNTER — Ambulatory Visit: Payer: BC Managed Care – PPO | Admitting: Allergy and Immunology

## 2018-01-23 ENCOUNTER — Encounter: Payer: Self-pay | Admitting: Allergy and Immunology

## 2018-01-23 ENCOUNTER — Telehealth: Payer: Self-pay

## 2018-01-23 VITALS — BP 132/70 | HR 84 | Temp 98.2°F | Resp 20 | Ht 64.0 in | Wt 223.2 lb

## 2018-01-23 DIAGNOSIS — R1313 Dysphagia, pharyngeal phase: Secondary | ICD-10-CM | POA: Diagnosis not present

## 2018-01-23 DIAGNOSIS — G43909 Migraine, unspecified, not intractable, without status migrainosus: Secondary | ICD-10-CM | POA: Diagnosis not present

## 2018-01-23 DIAGNOSIS — J3089 Other allergic rhinitis: Secondary | ICD-10-CM

## 2018-01-23 DIAGNOSIS — G4440 Drug-induced headache, not elsewhere classified, not intractable: Secondary | ICD-10-CM | POA: Diagnosis not present

## 2018-01-23 DIAGNOSIS — K219 Gastro-esophageal reflux disease without esophagitis: Secondary | ICD-10-CM | POA: Diagnosis not present

## 2018-01-23 DIAGNOSIS — J4540 Moderate persistent asthma, uncomplicated: Secondary | ICD-10-CM | POA: Diagnosis not present

## 2018-01-23 MED ORDER — RANITIDINE HCL 300 MG PO CAPS: 300.0000 mg | ORAL_CAPSULE | Freq: Every evening | ORAL | 5 refills | Status: DC

## 2018-01-23 MED ORDER — PANTOPRAZOLE SODIUM 40 MG PO TBEC: 40.0000 mg | DELAYED_RELEASE_TABLET | Freq: Two times a day (BID) | ORAL | 5 refills | Status: DC

## 2018-01-23 MED ORDER — MONTELUKAST SODIUM 10 MG PO TABS: 10.0000 mg | ORAL_TABLET | Freq: Every day | ORAL | 5 refills | Status: DC

## 2018-01-23 MED ORDER — CYPROHEPTADINE HCL 4 MG PO TABS: 4.0000 mg | ORAL_TABLET | Freq: Every day | ORAL | 5 refills | Status: DC

## 2018-01-23 MED ORDER — FLUTICASONE PROPIONATE 50 MCG/ACT NA SUSP: 1.0000 | Freq: Every day | NASAL | 5 refills | Status: DC

## 2018-01-23 NOTE — Telephone Encounter (Signed)
[id number]. ID [id number]. CPT [id number]

## 2018-01-23 NOTE — Progress Notes (Signed)
Dear Dr. Leonor Liv,  Thank you for referring Michelle Miles to the [facility] [place] on 01/23/2018.   Below is a summation of this patient's evaluation and recommendations.  Thank you for your referral. I will keep you informed about this patient's response to treatment.   If you have any questions please do not hesitate to contact me.   Sincerely,  Michelle Priest, MD Allergy / Immunology [facility]   ______________________________________________________________________    NEW PATIENT NOTE  Referring Provider: Marylen Ponto, MD Primary Provider: Marylen Ponto, MD Date of office visit: 01/23/2018    Subjective:   Chief Complaint:  Michelle Miles (DOB: 14-Nov-1971) is a 47 y.o. female who Miles to the clinic on 01/23/2018 with a chief complaint of No chief complaint on file. Michelle Kitchen     HPI: Michelle Miles to this clinic in evaluation of multiple issues.  First, she has a chronic cough and has had this problem for many years.  She has seen a pulmonologist in the past for this issue.  She complains of having spells of cough associated with micturation and posttussive emesis.  Her cough is more prevalent when being outdoors in cold weather and after laying down.  She does have associated throat clearing and hoarseness and she develops "choking" with eating.  Sometimes she finds it difficult to transition food from her mouth down further in her esophagus.  She does not have obstruction to swallowing but just difficulty transitioning food.  This does not appear to occur with liquids.  She must break pills in half to swallow because of this issue. Along with her coughing she does get chest tightness and shortness of breath.  When she exercises she coughs excessively.   Second, she complains of nasal congestion and sneezing without any anosmia or ugly nasal discharge.  She had repair of a deviated  septum 16 years ago because she was having recurrent episodes of "sinusitis".  She still has episodes of sinusitis manifested as teeth pain and face ache and ugly nasal discharge.  There does not appear to be any significant seasonality associated with her upper airway symptoms and there is no obvious trigger.  For both these forms of respiratory tract problems she has been diagnosed with sinusitis and bronchitis and treated with multiple courses of antibiotics and systemic steroids.  She states that she has had at least 5 antibiotics administered since October 2018 for these issues. She is also been given narcotic-based cough medications which she uses quite regularly.  She has been treated with anti-inflammatory medications for respiratory tract administered on a chronic basis and has also been treated empirically for reflux induced respiratory disease.  Third, she has headaches.  She describes headaches in the bifrontal location and they are achy and pounding and occur on a daily basis usually starting shortly after awakening in the morning.  They will last all day and she will take ibuprofen and Tylenol about 3 times a day every day.  There is no associated scotoma or other neurological symptoms.  She does not consume any liquid caffeine or alcohol but she does eat chocolate on a daily basis.  In addition, she has very fractured sleep.  She is using hydrocodone based cough syrup at nighttime to help her sleep.  When she wakes up in the morning she feels totally unrefreshed and sleepy at around 1 PM in the afternoon.  Fortunately, she does not get sleepy while driving although if she is a passenger in the car she quickly falls asleep.  Fourth, she complains about ear fullness.  She has a history of Mnire's disease diagnosed over 30 years ago associated with dizziness and hearing loss and tinnitus for which she was treated with a diuretic successfully and for the most part she does not have a significant  amount of problem with vertigo or tinnitus for the past 10 years.  However, she does have a ear fullness and she has chronic left-sided hearing loss.  Past Medical History:  Diagnosis Date  . Asthma   . Chronic bronchitis (HCC)   . Chronic sinus infection    "used to get them all the time; recently had one after none in 3-4 years" (02/29/2016)  . Family history of adverse reaction to anesthesia    "daughter gets PONV & is hard to wake up"  . Hypokalemia   . Meniere's disease    trated with maxide  . PONV (postoperative nausea and vomiting)    also "slow to wake"  . Recurrent upper respiratory infection (URI)   . SVT (supraventricular tachycardia) (HCC)     Past Surgical History:  Procedure Laterality Date  . CARPAL TUNNEL RELEASE Right ~ 2008  . COLONOSCOPY N/A 02/03/2013   Procedure: COLONOSCOPY;  Surgeon: Barrie Folk, MD;  Location: [facility] ENDOSCOPY;  Service: Endoscopy;  Laterality: N/A;  . DILATION AND CURETTAGE OF UTERUS  1998   "1 wk after I had my son"  . ELECTROPHYSIOLOGIC STUDY N/A 02/29/2016   Procedure: SVT Ablation;  Surgeon: Will Jorja Loa, MD;  Location: [facility] INVASIVE CV LAB;  Service: Cardiovascular;  Laterality: N/A;  . ENDOMETRIAL ABLATION    . HEMORRHOID SURGERY N/A 02/19/2013   Procedure: PROCEDURE PROLAPSED HEMORRHOIDS;  Surgeon: Mariella Saa, MD;  Location: [facility] ORS;  Service: General;  Laterality: N/A;  . LAPAROSCOPIC CHOLECYSTECTOMY  ~ 2007  . NASAL SEPTUM SURGERY  ~ 2010  . PLANTAR FASCIA RELEASE Right 2000s   right  . SINOSCOPY    . SUPRAVENTRICULAR TACHYCARDIA ABLATION  02/29/2016    Allergies as of 01/23/2018      Reactions   Caffeine Other (See Comments)   Makes pt light headed   Codeine Nausea And Vomiting   Floxin [ofloxacin] Other (See Comments)   Causes hallucinations   Latex Rash   Prednisone Rash   Oral only      Medication List      budesonide-formoterol 80-4.5 MCG/ACT inhaler Commonly known as:  SYMBICORT Inhale 2 puffs into the  lungs 2 (two) times daily.   cholecalciferol 1000 units tablet Commonly known as:  VITAMIN D Take 1,000 Units by mouth daily.   escitalopram 5 MG tablet Commonly known as:  LEXAPRO Take 5 mg by mouth daily.   fluticasone 50 MCG/ACT nasal spray Commonly known as:  FLONASE Place 2 sprays into both nostrils daily.   loratadine 10 MG tablet Commonly known as:  CLARITIN Take 10 mg by mouth daily.   montelukast 10 MG tablet Commonly known as:  SINGULAIR Take 1 tablet (10 mg total) by mouth at bedtime.   pantoprazole 40 MG tablet Commonly known as:  PROTONIX Take 1 tablet (40 mg total) by mouth daily. Take 30-60 min before first meal of the day   potassium chloride SA 20 MEQ tablet Commonly known as:  K-DUR,KLOR-CON Take 20 mEq by mouth 2 (two) times daily.   PROAIR HFA 108 (90 Base) MCG/ACT  inhaler Generic drug:  albuterol Inhale 2 puffs into the lungs every 6 (six) hours as needed for wheezing or shortness of breath.   triamterene-hydrochlorothiazide 37.5-25 MG tablet Commonly known as:  MAXZIDE-25 Take 1 each (1 tablet total) by mouth daily.   vitamin B-12 100 MCG tablet Commonly known as:  CYANOCOBALAMIN Take 100 mcg by mouth daily.   VITAMIN E PO Take 1 tablet by mouth daily.       Review of systems negative except as noted in HPI / PMHx or noted below:  Review of Systems  Constitutional: Negative.   HENT: Negative.   Eyes: Negative.   Respiratory: Negative.   Cardiovascular: Negative.   Gastrointestinal: Negative.   Genitourinary: Negative.   Musculoskeletal: Negative.   Skin: Negative.   Neurological: Negative.   Endo/Heme/Allergies: Negative.   Psychiatric/Behavioral: Negative.     Family History  Problem Relation Age of Onset  . Stroke Father   . Cancer Maternal Grandmother        breast    Social History   Socioeconomic History  . Marital status: Married    Spouse name: Not on file  . Number of children: Not on file  . Years of  education: Not on file  . Highest education level: Not on file  Social Needs  . Financial resource strain: Not on file  . Food insecurity - worry: Not on file  . Food insecurity - inability: Not on file  . Transportation needs - medical: Not on file  . Transportation needs - non-medical: Not on file  Occupational History  . Not on file  Tobacco Use  . Smoking status: Never Smoker  . Smokeless tobacco: Never Used  Substance and Sexual Activity  . Alcohol use: No  . Drug use: No  . Sexual activity: Not on file  Other Topics Concern  . Not on file  Social History Narrative  . Not on file    Environmental and Social history  Lives in a house with a dry environment, no animals located inside the household, carpet in the bedroom, plastic on the bed, plastic on the pillow, and no smokers located inside the household.  She is a Architectural technologist.  Objective:   Vitals:   01/23/18 1425  BP: 132/70  Pulse: 84  Resp: 20  Temp: 98.2 F (36.8 C)   Height: 5\' 4"  (162.6 cm) Weight: 223 lb 3.2 oz (101.2 kg)  Physical Exam  Constitutional: She is well-developed, well-nourished, and in no distress.  Coughing, throat clearing, raspy voice  HENT:  Head: Normocephalic. Head is without right periorbital erythema and without left periorbital erythema.  Right Ear: Tympanic membrane, external ear and ear canal normal.  Left Ear: Tympanic membrane, external ear and ear canal normal.  Nose: Nose normal. No mucosal edema or rhinorrhea.  Mouth/Throat: Oropharynx is clear and moist and mucous membranes are normal. No oropharyngeal exudate.  Eyes: Conjunctivae and lids are normal. Pupils are equal, round, and reactive to light.  Neck: Trachea normal. No tracheal deviation present. No thyromegaly present.  Cardiovascular: Normal rate, regular rhythm, S1 normal, S2 normal and normal heart sounds.  No murmur heard. Pulmonary/Chest: Effort normal. No stridor. No tachypnea. No respiratory distress.  She has no wheezes. She has no rales. She exhibits no tenderness.  Abdominal: Soft. She exhibits no distension and no mass. There is no hepatosplenomegaly. There is no tenderness. There is no rebound and no guarding.  Musculoskeletal: She exhibits no edema or tenderness.  Lymphadenopathy:  Head (right side): No tonsillar adenopathy present.       Head (left side): No tonsillar adenopathy present.    She has no cervical adenopathy.    She has no axillary adenopathy.  Neurological: She is alert. Gait normal.  Skin: No rash noted. She is not diaphoretic. No erythema. No pallor. Nails show no clubbing.  Psychiatric: Mood and affect normal.    Diagnostics: Allergy skin tests were performed.  She demonstrated hypersensitivity to house dust mite and mold.  Spirometry was performed and demonstrated an FEV1 of 2.46 @ 85 % of predicted. FEV1/FVC = 0.89.  Following the administration of nebulized albuterol her FEV1 rose to 2.75 which was an increase in the FEV1 of 12%.  Review of sinus CT scan performed 12 April 2017 identified the following:  The visualized paranasal sinuses are well-aerated. The minimally visualized mastoid air cells are also well aerated. No mucosal thickening is seen. No polyps or mucus retention cysts are identified.  Review of chest x-ray dated 26 February 2017 identified no significant abnormality without infiltrates, masses, edema, or effusions.  Review of blood tests obtained 06 April 2017 identified serum IgE 122 KU/L, with evidence of IgE antibodies directed against dust mite, WBC 7.4 with absolute eosinophil 100, absolute lymphocyte 2500, hemoglobin 14.3, platelet 312   Assessment and Plan:    1. Not well controlled moderate persistent asthma   2. Other allergic rhinitis   3. LPRD (laryngopharyngeal reflux disease)   4. Migraine syndrome   5. Medication overuse headache   6. Pharyngeal dysphagia     1.  Allergen avoidance measures  2.  Treat and prevent  headache:   A.  Taper off all forms of caffeine and chocolate  B.  Eliminate use of daily analgesic agents  C.  Start Periactin 4 mg tablet at bedtime  3.  Treat and prevent reflux:   A.  Taper off all forms of caffeine and chocolate  B.  Increase Protonix 40 mg twice a day  C.  Start ranitidine 300 mg in the evening  4.  Treat and prevent inflammation:   A.  Flonase 1 spray each nostril twice a day  B.  Symbicort 160 - 2 inhalations twice a day with spacer  C.  Montelukast 10 mg daily  5.  Obtain modified barium swallow  6.  Obtain ENT evaluation of throat  7.  If needed:   A.  Claritin or Zyrtec 10 mg 1 tablet once a day  B.  Pro Air HFA 2 puffs every 4-6 hours  C.  Nasal saline spray  8.  Return to clinic in 3 weeks or earlier if problem    Michelle Miles has several distinct issues giving rise to respiratory tract symptoms.  She appears to have inflammation of her airway and also appears to have pharyngeal dysfunction that may be contributing somewhat to this issue.  In addition, she has chronic daily headache with a component of migraine and medication overuse headaches.  I have made a plan to address each 1 of these issues as noted above and I will see her back in this clinic in approximately 3 weeks to assess her response.  The one issue we are not addressing today is possible sleep apnea given her fractured sleep and her daytime sleepiness and her headaches.  I will hold off on any further evaluation for this possible disorder until we can see what type of response she receives with the therapy noted above.  Michelle PriestEric J. Lan Mcneill,  MD Allergy / Immunology [facility]

## 2018-01-23 NOTE — Telephone Encounter (Signed)
Dr. Lucie LeatherKozlow would like a modified barium swallow completed for this patient.

## 2018-01-23 NOTE — Patient Instructions (Addendum)
  1.  Allergen avoidance measures  2.  Treat and prevent headache:   A.  Taper off all forms of caffeine and chocolate  B.  Eliminate use of daily analgesic agents  C.  Start Periactin 4 mg tablet at bedtime  3.  Treat and prevent reflux:   A.  Taper off all forms of caffeine and chocolate  B.  Increase Protonix 40 mg twice a day  C.  Start ranitidine 300 mg in the evening  4.  Treat and prevent inflammation:   A.  Flonase 1 spray each nostril twice a day  B.  Symbicort 160 - 2 inhalations twice a day with spacer  C.  Montelukast 10 mg daily  5.  Obtain modified barium swallow  6.  Obtain ENT evaluation of throat  7.  If needed:   A.  Claritin or Zyrtec 10 mg 1 tablet once a day  B.  Pro Air HFA 2 puffs every 4-6 hours  C.  Nasal saline spray  8.  Return to clinic in 3 weeks or earlier if problem

## 2018-01-24 ENCOUNTER — Telehealth: Payer: Self-pay | Admitting: Allergy and Immunology

## 2018-01-24 ENCOUNTER — Encounter: Payer: Self-pay | Admitting: Allergy and Immunology

## 2018-01-24 NOTE — Telephone Encounter (Signed)
[facility] will [facility] the patient to set up an appt

## 2018-01-24 NOTE — Telephone Encounter (Signed)
Order for modified barium swallow has been entered. Can you please schedule this patient for a modified barium swallow. No precert required

## 2018-01-24 NOTE — Telephone Encounter (Signed)
No precert required. Reference# [id number]

## 2018-01-24 NOTE — Telephone Encounter (Signed)
Correct

## 2018-01-24 NOTE — Addendum Note (Signed)
Addended by: Mliss FritzBLACK, Keven Osborn I on: 01/24/2018 10:19 AM   Modules accepted: Orders

## 2018-01-24 NOTE — Telephone Encounter (Signed)
fyi

## 2018-01-24 NOTE — Telephone Encounter (Signed)
Order was sent to [facility] for [facility] MODIFIED BARIUM SWALLOW Is this correct?? They will contact the patient directly to schedule an appt.

## 2018-01-24 NOTE — Telephone Encounter (Signed)
-----   Message from Exie ParodyKayla I Black, [place] sent at 01/23/2018  4:24 PM EST ----- Regarding: ENT Ref[facility] She does not [place] a requested one.     Thank you.

## 2018-01-25 NOTE — Telephone Encounter (Signed)
The order has been signed

## 2018-01-25 NOTE — Addendum Note (Signed)
Addended by: Mliss FritzBLACK, Hedwig Mcfall I on: 01/25/2018 07:05 AM   Modules accepted: Orders

## 2018-01-28 ENCOUNTER — Other Ambulatory Visit (HOSPITAL_COMMUNITY): Payer: Self-pay | Admitting: Allergy and Immunology

## 2018-01-28 DIAGNOSIS — R1310 Dysphagia, unspecified: Secondary | ICD-10-CM

## 2018-02-04 ENCOUNTER — Other Ambulatory Visit (INDEPENDENT_AMBULATORY_CARE_PROVIDER_SITE_OTHER): Payer: Self-pay | Admitting: Otolaryngology

## 2018-02-04 DIAGNOSIS — J329 Chronic sinusitis, unspecified: Secondary | ICD-10-CM

## 2018-02-05 ENCOUNTER — Ambulatory Visit (HOSPITAL_COMMUNITY)
Admission: RE | Admit: 2018-02-05 | Discharge: 2018-02-05 | Disposition: A | Discharge status: Home / Self Care | Payer: BC Managed Care – PPO | Source: Ambulatory Visit | Attending: Allergy and Immunology | Admitting: Allergy and Immunology

## 2018-02-05 DIAGNOSIS — R1310 Dysphagia, unspecified: Secondary | ICD-10-CM

## 2018-02-05 DIAGNOSIS — R1313 Dysphagia, pharyngeal phase: Secondary | ICD-10-CM

## 2018-02-13 ENCOUNTER — Ambulatory Visit: Payer: BC Managed Care – PPO | Admitting: Allergy and Immunology

## 2018-02-16 ENCOUNTER — Inpatient Hospital Stay
Admission: RE | Admit: 2018-02-16 | Discharge: 2018-02-16 | Disposition: A | Discharge status: Home / Self Care | Payer: BC Managed Care – PPO | Source: Ambulatory Visit | Attending: Otolaryngology | Admitting: Otolaryngology

## 2018-02-20 ENCOUNTER — Ambulatory Visit (INDEPENDENT_AMBULATORY_CARE_PROVIDER_SITE_OTHER): Payer: BC Managed Care – PPO | Admitting: Allergy and Immunology

## 2018-02-20 ENCOUNTER — Encounter: Payer: Self-pay | Admitting: Allergy and Immunology

## 2018-02-20 VITALS — BP 122/70 | HR 88 | Resp 20

## 2018-02-20 DIAGNOSIS — G43909 Migraine, unspecified, not intractable, without status migrainosus: Secondary | ICD-10-CM

## 2018-02-20 DIAGNOSIS — K219 Gastro-esophageal reflux disease without esophagitis: Secondary | ICD-10-CM

## 2018-02-20 DIAGNOSIS — J4540 Moderate persistent asthma, uncomplicated: Secondary | ICD-10-CM | POA: Diagnosis not present

## 2018-02-20 DIAGNOSIS — G933 Postviral fatigue syndrome: Secondary | ICD-10-CM | POA: Diagnosis not present

## 2018-02-20 DIAGNOSIS — J3089 Other allergic rhinitis: Secondary | ICD-10-CM

## 2018-02-20 DIAGNOSIS — K224 Dyskinesia of esophagus: Secondary | ICD-10-CM

## 2018-02-20 DIAGNOSIS — G4440 Drug-induced headache, not elsewhere classified, not intractable: Secondary | ICD-10-CM | POA: Diagnosis not present

## 2018-02-20 DIAGNOSIS — G9331 Postviral fatigue syndrome: Secondary | ICD-10-CM

## 2018-02-20 MED ORDER — BUDESONIDE-FORMOTEROL FUMARATE 160-4.5 MCG/ACT IN AERO: INHALATION_SPRAY | RESPIRATORY_TRACT | 5 refills | Status: DC

## 2018-02-20 NOTE — Progress Notes (Signed)
Follow-up Note  Referring Provider: Marylen Ponto, MD Primary Provider: Marylen Ponto, MD Date of Office Visit: 02/20/2018  Subjective:   Michelle Miles (DOB: August 03, 1971) is a 47 y.o. female [facility] in re-evaluation of the following:  HPI: Michelle Miles returns to this clinic in reevaluation of asthma, allergic rhinitis, LPR, migraine syndrome, medication overuse headaches and a swallowing problem.  I last saw her in this clinic during her initial evaluation of 23 January 2018 at which point in time we attempted to address each issue.  Unfortunately, this past Friday, 6 days ago, she developed influenza requiring the administration of Tamiflu and she has been coughing like crazy.  She went to see her family doctor on Monday and was diagnosed with "bronchitis" and given a steroid injection and Augmentin.  She no longer has a fever over the course of the past several days but she still continues to cough significantly.  Prior to this event she was really doing very well and was resolving almost all of her issues.  Her chronic cough had basically abated.  She had a tremendous improvement regarding her throat and her choking and her nasal congestion and sneezing and she completely resolved all of her headaches and her ear fullness was significantly improved.  She did discontinue all forms of caffeine consumption including chocolate and eliminated all use of analgesic agents and was consistently utilizing anti-inflammatory agents for respiratory tract inflammation and therapy directed against reflux and continuing on Periactin at bedtime.  She did have her modified barium swallow completed as well as evaluation with the ENT as noted below.  Allergies as of 02/20/2018      Reactions   Caffeine Other (See Comments)   Makes pt light headed   Codeine Nausea And Vomiting   Floxin [ofloxacin] Other (See Comments)   Causes hallucinations   Latex  Rash   Prednisone Rash   Oral only      Medication List      amoxicillin-clavulanate 875-125 MG tablet Commonly known as:  AUGMENTIN   budesonide-formoterol 160-4.5 MCG/ACT inhaler Commonly known as:  SYMBICORT Inhale two puffs twice daily to prevent cough or wheeze. Rinse mouth after use.   cholecalciferol 1000 units tablet Commonly known as:  VITAMIN D Take 1,000 Units by mouth daily.   cyproheptadine 4 MG tablet Commonly known as:  PERIACTIN Take 1 tablet (4 mg total) by mouth at bedtime.   escitalopram 5 MG tablet Commonly known as:  LEXAPRO Take 5 mg by mouth daily.   fluticasone 50 MCG/ACT nasal spray Commonly known as:  FLONASE Place 1 spray into both nostrils daily.   ipratropium 0.06 % nasal spray Commonly known as:  ATROVENT   loratadine 10 MG tablet Commonly known as:  CLARITIN Take 10 mg by mouth daily.   montelukast 10 MG tablet Commonly known as:  SINGULAIR Take 1 tablet (10 mg total) by mouth at bedtime.   pantoprazole 40 MG tablet Commonly known as:  PROTONIX Take 1 tablet (40 mg total) by mouth 2 (two) times daily. Take 30-60 min before first meal of the day   potassium chloride SA 20 MEQ tablet Commonly known as:  K-DUR,KLOR-CON Take 20 mEq by mouth 2 (two) times daily.   PROAIR HFA 108 (90 Base) MCG/ACT inhaler Generic drug:  albuterol Inhale 2 puffs into the lungs every 6 (six) hours as needed for wheezing or shortness of breath.   ranitidine 300 MG  capsule Commonly known as:  ZANTAC Take 1 capsule (300 mg total) by mouth every evening.   triamterene-hydrochlorothiazide 37.5-25 MG tablet Commonly known as:  MAXZIDE-25 Take 1 each (1 tablet total) by mouth daily.   vitamin B-12 100 MCG tablet Commonly known as:  CYANOCOBALAMIN Take 100 mcg by mouth daily.   VITAMIN E PO Take 1 tablet by mouth daily.        Past Medical History:  Diagnosis Date  . Asthma   . Chronic bronchitis (HCC)   . Chronic sinus infection    "used to  get them all the time; recently had one after none in 3-4 years" (02/29/2016)  . Family history of adverse reaction to anesthesia    "daughter gets PONV & is hard to wake up"  . Hypokalemia   . Meniere's disease    trated with maxide  . PONV (postoperative nausea and vomiting)    also "slow to wake"  . Recurrent upper respiratory infection (URI)   . SVT (supraventricular tachycardia) (HCC)     Past Surgical History:  Procedure Laterality Date  . CARPAL TUNNEL RELEASE Right ~ 2008  . COLONOSCOPY N/A 02/03/2013   Procedure: COLONOSCOPY;  Surgeon: Barrie Folk, MD;  Location: [facility] ENDOSCOPY;  Service: Endoscopy;  Laterality: N/A;  . DILATION AND CURETTAGE OF UTERUS  1998   "1 wk after I had my son"  . ELECTROPHYSIOLOGIC STUDY N/A 02/29/2016   Procedure: SVT Ablation;  Surgeon: Will Jorja Loa, MD;  Location: [facility];  Service: Cardiovascular;  Laterality: N/A;  . ENDOMETRIAL ABLATION    . HEMORRHOID SURGERY N/A 02/19/2013   Procedure: PROCEDURE PROLAPSED HEMORRHOIDS;  Surgeon: Mariella Saa, MD;  Location: [facility];  Service: General;  Laterality: N/A;  . LAPAROSCOPIC CHOLECYSTECTOMY  ~ 2007  . NASAL SEPTUM SURGERY  ~ 2010  . PLANTAR FASCIA RELEASE Right 2000s   right  . SINOSCOPY    . SUPRAVENTRICULAR TACHYCARDIA ABLATION  02/29/2016    Review of systems negative except as noted in HPI / PMHx or noted below:  Review of Systems  Constitutional: Negative.   HENT: Negative.   Eyes: Negative.   Respiratory: Negative.   Cardiovascular: Negative.   Gastrointestinal: Negative.   Genitourinary: Negative.   Musculoskeletal: Negative.   Skin: Negative.   Neurological: Negative.   Endo/Heme/Allergies: Negative.   Psychiatric/Behavioral: Negative.      Objective:   Vitals:   02/20/18 1559  BP: 122/70  Pulse: 88  Resp: 20          Physical Exam  Constitutional: She is well-developed, well-nourished, and in no distress.  Coughing  HENT:  Head: Normocephalic.    Right Ear: Tympanic membrane, external ear and ear canal normal.  Left Ear: Tympanic membrane, external ear and ear canal normal.  Nose: Nose normal. No mucosal edema or rhinorrhea.  Mouth/Throat: Uvula is midline, oropharynx is clear and moist and mucous membranes are normal. No oropharyngeal exudate.  Eyes: Conjunctivae are normal.  Neck: Trachea normal. No tracheal tenderness present. No tracheal deviation present. No thyromegaly present.  Cardiovascular: Normal rate, regular rhythm, S1 normal, S2 normal and normal heart sounds.  No murmur heard. Pulmonary/Chest: Breath sounds normal. No stridor. No respiratory distress. She has no wheezes. She has no rales.  Musculoskeletal: She exhibits no edema.  Lymphadenopathy:       Head (right side): No tonsillar adenopathy present.       Head (left side): No tonsillar adenopathy present.    She  has no cervical adenopathy.  Neurological: She is alert. Gait normal.  Skin: No rash noted. She is not diaphoretic. No erythema. Nails show no clubbing.  Psychiatric: Mood and affect normal.    Diagnostics:   The patient had an Asthma Control Test with the following results: ACT Total Score: 11.    Results of a modified barium swallow obtained 05 February 2018 identified the following:  Normal oropharyngeal swallow with adequate mastication, timely swallow response, adequate hyolaryngeal mobility and relaxation of the UES to allow material into the esophagus.   She was observed to cough multiple times during study - there was no penetration/aspiration associated with cough.  Intermittent esophageal sweep revealed barium stasis with both liquid and solid consistencies.   Review of a note forwarded by Dr. Suszanne Connerseoh, ENT, dated 29 January 2018 refers to moderate posterior laryngeal edema.  He recommended a sinus CT scan and the use of gabapentin.  Victorino DikeJennifer did not use the gabapentin.  Assessment and Plan:   1. Asthma, moderate persistent, well-controlled    2. Other allergic rhinitis   3. LPRD (laryngopharyngeal reflux disease)   4. Migraine syndrome   5. Medication overuse headache   6. Esophageal dysmotility   7. Post-influenza syndrome     1.  Allergen avoidance measures  2.  Continue to Treat and prevent headache:   A.  Remain off all forms of caffeine and chocolate  B.  Remain off daily analgesic agents  C.  Periactin 4 mg tablet at bedtime  3.  Continue to Treat and prevent reflux:   A.  Remain off all forms of caffeine and chocolate  B.  Protonix 40 mg twice a day  C.  ranitidine 300 mg in the evening  4.  Continue to Treat and prevent inflammation:   A.  Flonase 1 spray each nostril twice a day  B.  Symbicort 160 - 2 inhalations twice a day with spacer  C.  Montelukast 10 mg daily  5.  Obtain full barium swallow in mid to late March  6. If needed:   A.  Claritin or Zyrtec 10 mg 1 tablet once a day  B.  Pro Air HFA 2 puffs every 4-6 hours  C.  Nasal saline spray  8.  Return to clinic in 8 weeks or earlier if problem    It appears that we are on the right track regarding Ryan's respiratory tract symptoms with aggressive therapy directed against reflux and inflammation.  Unfortunately, her recent bout of influenza is going to complicate this issue over the course of the next several weeks.  Her incessant coughing is probably just post viral syndrome and may go on up to 12 weeks after onset of her influenza.  There does appear to be some esophageal dysmotility identified on her modified barium swallow and when she gets a little bit better with some other issues we can obtain a full barium swallow to further define peristaltic activity.  I will see her back in this clinic in 8 weeks or earlier if there is a problem.  Laurette SchimkeEric Kozlow, MD Allergy / Immunology [facility]

## 2018-02-20 NOTE — Patient Instructions (Signed)
  1.  Allergen avoidance measures  2.  Continue to Treat and prevent headache:   A.  Remain off all forms of caffeine and chocolate  B.  Remain off daily analgesic agents  C.  Periactin 4 mg tablet at bedtime  3.  Continue to Treat and prevent reflux:   A.  Remain off all forms of caffeine and chocolate  B.  Protonix 40 mg twice a day  C.  ranitidine 300 mg in the evening  4.  Continue to Treat and prevent inflammation:   A.  Flonase 1 spray each nostril twice a day  B.  Symbicort 160 - 2 inhalations twice a day with spacer  C.  Montelukast 10 mg daily  5.  Obtain full barium swallow in mid to late March  6. If needed:   A.  Claritin or Zyrtec 10 mg 1 tablet once a day  B.  Pro Air HFA 2 puffs every 4-6 hours  C.  Nasal saline spray  8.  Return to clinic in 8 weeks or earlier if problem

## 2018-02-21 ENCOUNTER — Encounter: Payer: Self-pay | Admitting: Allergy and Immunology

## 2018-02-26 ENCOUNTER — Telehealth: Payer: Self-pay | Admitting: *Deleted

## 2018-02-26 NOTE — Telephone Encounter (Signed)
[facility] at 8:30 arriving at 8:00. NPO after midnight the night before. Patient aware of appt and order faxed.

## 2018-02-27 ENCOUNTER — Ambulatory Visit
Admission: RE | Admit: 2018-02-27 | Discharge: 2018-02-27 | Disposition: A | Discharge status: Home / Self Care | Payer: BC Managed Care – PPO | Source: Ambulatory Visit | Attending: Otolaryngology | Admitting: Otolaryngology

## 2018-02-27 DIAGNOSIS — J329 Chronic sinusitis, unspecified: Secondary | ICD-10-CM

## 2018-04-08 ENCOUNTER — Telehealth: Payer: Self-pay

## 2018-04-08 NOTE — Telephone Encounter (Signed)
Left message for patient to call the office. Per Dr. Lucie LeatherKozlow, please inform patient that barium swallow study is perfect without defect and he will discuss things further with her during her return visit. Will scan results into Epic.

## 2018-04-08 NOTE — Telephone Encounter (Signed)
Patient informed of results by front office staff, Joni ReiningNicole.

## 2018-04-11 ENCOUNTER — Encounter: Payer: Self-pay | Admitting: *Deleted

## 2018-04-18 ENCOUNTER — Ambulatory Visit: Payer: BC Managed Care – PPO | Admitting: Allergy and Immunology

## 2018-05-02 ENCOUNTER — Encounter: Payer: Self-pay | Admitting: Allergy and Immunology

## 2018-05-02 ENCOUNTER — Ambulatory Visit (INDEPENDENT_AMBULATORY_CARE_PROVIDER_SITE_OTHER): Payer: BC Managed Care – PPO | Admitting: Allergy and Immunology

## 2018-05-02 VITALS — BP 110/72 | HR 84 | Resp 16

## 2018-05-02 DIAGNOSIS — J3089 Other allergic rhinitis: Secondary | ICD-10-CM | POA: Diagnosis not present

## 2018-05-02 DIAGNOSIS — J4540 Moderate persistent asthma, uncomplicated: Secondary | ICD-10-CM | POA: Diagnosis not present

## 2018-05-02 DIAGNOSIS — B370 Candidal stomatitis: Secondary | ICD-10-CM | POA: Diagnosis not present

## 2018-05-02 DIAGNOSIS — K219 Gastro-esophageal reflux disease without esophagitis: Secondary | ICD-10-CM

## 2018-05-02 DIAGNOSIS — G43909 Migraine, unspecified, not intractable, without status migrainosus: Secondary | ICD-10-CM

## 2018-05-02 DIAGNOSIS — K224 Dyskinesia of esophagus: Secondary | ICD-10-CM | POA: Diagnosis not present

## 2018-05-02 MED ORDER — FLUCONAZOLE 150 MG PO TABS: ORAL_TABLET | ORAL | 0 refills | Status: DC

## 2018-05-02 MED ORDER — BUDESONIDE-FORMOTEROL FUMARATE 80-4.5 MCG/ACT IN AERO: INHALATION_SPRAY | RESPIRATORY_TRACT | 5 refills | Status: DC

## 2018-05-02 NOTE — Progress Notes (Signed)
Follow-up Note  Referring Provider: Marylen Ponto, MD Primary Provider: Marylen Ponto, MD Date of Office Visit: 05/02/2018  Subjective:   Michelle Miles (DOB: [birth year]) is a 47 y.o. female who [facility] in re-evaluation of the following:  HPI: Ceri returns to this clinic in reevaluation of her multiorgan inflammatory state manifested as asthma and allergic rhinitis and LPR and migraine headache and medication overuse headache and what appeared to be some esophageal dysmotility.  Her last visit to this clinic was 20 February 2018.  She is really much better.  She has almost no cough.  She has no choking.  She has no nasal congestion and sneezing.  Her headaches are gone.  She no longer uses any Periactin and she remains away from caffeine and chocolate.  She does not use a short acting bronchodilator.  She has had an issue develop with thrush.  For the past 2 weeks she has decreased her Symbicort to at least half a dose and she took some anti-thrush liquid medicine that she had laying around the house.  Allergies as of 05/02/2018      Reactions   Caffeine Other (See Comments)   Makes pt light headed   Codeine Nausea And Vomiting   Floxin [ofloxacin] Other (See Comments)   Causes hallucinations   Latex Rash   Prednisone Rash   Oral only      Medication List      budesonide-formoterol 160-4.5 MCG/ACT inhaler Commonly known as:  SYMBICORT Inhale two puffs twice daily to prevent cough or wheeze. Rinse mouth after use.   cholecalciferol 1000 units tablet Commonly known as:  VITAMIN D Take 1,000 Units by mouth daily.   escitalopram 5 MG tablet Commonly known as:  LEXAPRO Take 5 mg by mouth daily.   fluticasone 50 MCG/ACT nasal spray Commonly known as:  FLONASE Place 1 spray into both nostrils daily.   ipratropium 0.06 % nasal spray Commonly known as:  ATROVENT   montelukast 10 MG tablet Commonly known as:   SINGULAIR Take 1 tablet (10 mg total) by mouth at bedtime.   pantoprazole 40 MG tablet Commonly known as:  PROTONIX Take 1 tablet (40 mg total) by mouth 2 (two) times daily. Take 30-60 min before first meal of the day   potassium chloride SA 20 MEQ tablet Commonly known as:  K-DUR,KLOR-CON Take 20 mEq by mouth 2 (two) times daily.   PROAIR HFA 108 (90 Base) MCG/ACT inhaler Generic drug:  albuterol Inhale 2 puffs into the lungs every 6 (six) hours as needed for wheezing or shortness of breath.   ranitidine 300 MG capsule Commonly known as:  ZANTAC Take 1 capsule (300 mg total) by mouth every evening.   triamterene-hydrochlorothiazide 37.5-25 MG tablet Commonly known as:  MAXZIDE-25 Take 1 each (1 tablet total) by mouth daily.   vitamin B-12 100 MCG tablet Commonly known as:  CYANOCOBALAMIN Take 100 mcg by mouth daily.   VITAMIN E PO Take 1 tablet by mouth daily.       Past Medical History:  Diagnosis Date  . Asthma   . Chronic bronchitis (HCC)   . Chronic sinus infection    "used to get them all the time; recently had one after none in 3-4 years" (02/29/2016)  . Family history of adverse reaction to anesthesia    "daughter gets PONV & is hard to wake up"  . Hypokalemia   . Meniere's disease  trated with maxide  . PONV (postoperative nausea and vomiting)    also "slow to wake"  . Recurrent upper respiratory infection (URI)   . SVT (supraventricular tachycardia) (HCC)     Past Surgical History:  Procedure Laterality Date  . CARPAL TUNNEL RELEASE Right ~ 2008  . COLONOSCOPY N/A 02/03/2013   Procedure: COLONOSCOPY;  Surgeon: Barrie Folk, MD;  Location: [facility] ENDOSCOPY;  Service: Endoscopy;  Laterality: N/A;  . DILATION AND CURETTAGE OF UTERUS  1998   "1 wk after I had my son"  . ELECTROPHYSIOLOGIC STUDY N/A 02/29/2016   Procedure: SVT Ablation;  Surgeon: Will Jorja Loa, MD;  Location: [facility] INVASIVE CV LAB;  Service: Cardiovascular;  Laterality: N/A;  . ENDOMETRIAL  ABLATION    . HEMORRHOID SURGERY N/A 02/19/2013   Procedure: PROCEDURE PROLAPSED HEMORRHOIDS;  Surgeon: Mariella Saa, MD;  Location: [facility];  Service: General;  Laterality: N/A;  . LAPAROSCOPIC CHOLECYSTECTOMY  ~ 2007  . NASAL SEPTUM SURGERY  ~ 2010  . PLANTAR FASCIA RELEASE Right 2000s   right  . SINOSCOPY    . SUPRAVENTRICULAR TACHYCARDIA ABLATION  02/29/2016    Review of systems negative except as noted in HPI / PMHx or noted below:  Review of Systems  Constitutional: Negative.   HENT: Negative.   Eyes: Negative.   Respiratory: Negative.   Cardiovascular: Negative.   Gastrointestinal: Negative.   Genitourinary: Negative.   Musculoskeletal: Negative.   Skin: Negative.   Neurological: Negative.   Endo/Heme/Allergies: Negative.   Psychiatric/Behavioral: Negative.      Objective:   Vitals:   05/02/18 1559  BP: 110/72  Pulse: 84  Resp: 16          Physical Exam  Constitutional:  Slightly raspy voice  HENT:  Head: Normocephalic.  Right Ear: Tympanic membrane, external ear and ear canal normal.  Left Ear: Tympanic membrane, external ear and ear canal normal.  Nose: Nose normal. No mucosal edema or rhinorrhea.  Mouth/Throat: Uvula is midline, oropharynx is clear and moist and mucous membranes are normal. No oropharyngeal exudate.  Eyes: Conjunctivae are normal.  Neck: Trachea normal. No tracheal tenderness present. No tracheal deviation present. No thyromegaly present.  Cardiovascular: Normal rate, regular rhythm, S1 normal, S2 normal and normal heart sounds.  No murmur heard. Pulmonary/Chest: Breath sounds normal. No stridor. No respiratory distress. She has no wheezes. She has no rales.  Musculoskeletal: She exhibits no edema.  Lymphadenopathy:       Head (right side): No tonsillar adenopathy present.       Head (left side): No tonsillar adenopathy present.    She has no cervical adenopathy.  Neurological: She is alert.  Skin: No rash noted. She is not  diaphoretic. No erythema. Nails show no clubbing.    Diagnostics:    Spirometry was performed and demonstrated an FEV1 of 2.60 at 96 % of predicted.  The patient had an Asthma Control Test with the following results: ACT Total Score: 16.    Assessment and Plan:   1. Asthma, moderate persistent, well-controlled   2. Other allergic rhinitis   3. LPRD (laryngopharyngeal reflux disease)   4. Migraine syndrome   5. Esophageal dysmotility   6. Thrush     1. Treat thrush with Diflucan  one dose today  2.  Continue to Treat and prevent headache:   A.  Remain off all forms of caffeine and chocolate  B.  Remain off daily analgesic agents  3.  Continue to Treat and prevent  reflux:   A.  Remain off all forms of caffeine and chocolate  B.  Protonix 40 mg twice a day  C.  ranitidine 300 mg in the evening  4.  Continue to Treat and prevent inflammation:   A.  Flonase 1 spray each nostril 1-2 times a day  B.  Symbicort 80 - 2 inhalations 1-2 times a day    C.  Montelukast 10 mg daily  5. If needed:   A.  Claritin or Zyrtec 10 mg 1 tablet once a day  B.  Pro Air HFA 2 puffs every 4-6 hours  C.  Nasal saline spray  8.  Return to clinic in 12 weeks or earlier if problem    Catharina appears to be doing much better at this point in time on a large collection of medical therapy directed against inflammation of her respiratory tract and reflux.  Will now start to decrease her dose of inhaled steroid exposure.  She can use Symbicort 1 or 2 times per day depending on disease activity while decreasing her Symbicort dose from Symbicort 160 to Symbicort 80.  Likewise, her Flonase can also be used 1 or 2 times per day depending on disease activity.  We will keep her on aggressive therapy for reflux at this point.  I will see her back in this clinic in 12 weeks or earlier if there is a problem.  Laurette Schimke, MD Allergy / Immunology [facility]

## 2018-05-02 NOTE — Patient Instructions (Addendum)
  1. Treat thrush with Diflucan  one dose today  2.  Continue to Treat and prevent headache:   A.  Remain off all forms of caffeine and chocolate  B.  Remain off daily analgesic agents  3.  Continue to Treat and prevent reflux:   A.  Remain off all forms of caffeine and chocolate  B.  Protonix 40 mg twice a day  C.  ranitidine 300 mg in the evening  4.  Continue to Treat and prevent inflammation:   A.  Flonase 1 spray each nostril 1-2 times a day  B.  Symbicort 80 - 2 inhalations 1-2 times a day    C.  Montelukast 10 mg daily  5. If needed:   A.  Claritin or Zyrtec 10 mg 1 tablet once a day  B.  Pro Air HFA 2 puffs every 4-6 hours  C.  Nasal saline spray  8.  Return to clinic in 12 weeks or earlier if problem

## 2018-05-06 ENCOUNTER — Encounter: Payer: Self-pay | Admitting: Allergy and Immunology

## 2018-07-13 ENCOUNTER — Other Ambulatory Visit: Payer: Self-pay | Admitting: Allergy and Immunology

## 2018-07-29 ENCOUNTER — Ambulatory Visit: Payer: BC Managed Care – PPO | Admitting: Allergy and Immunology

## 2018-09-02 ENCOUNTER — Ambulatory Visit: Payer: BC Managed Care – PPO | Admitting: Allergy and Immunology

## 2018-09-06 ENCOUNTER — Other Ambulatory Visit: Payer: Self-pay | Admitting: Allergy and Immunology

## 2018-09-06 ENCOUNTER — Other Ambulatory Visit: Payer: Self-pay | Admitting: *Deleted

## 2018-09-06 DIAGNOSIS — K219 Gastro-esophageal reflux disease without esophagitis: Secondary | ICD-10-CM

## 2018-09-06 MED ORDER — BUDESONIDE-FORMOTEROL FUMARATE 80-4.5 MCG/ACT IN AERO: INHALATION_SPRAY | RESPIRATORY_TRACT | 5 refills | Status: DC

## 2018-09-06 MED ORDER — PANTOPRAZOLE SODIUM 40 MG PO TBEC: 40.0000 mg | DELAYED_RELEASE_TABLET | Freq: Two times a day (BID) | ORAL | 5 refills | Status: DC

## 2018-09-18 ENCOUNTER — Other Ambulatory Visit: Payer: Self-pay | Admitting: Allergy and Immunology

## 2018-09-18 ENCOUNTER — Encounter: Payer: Self-pay | Admitting: Allergy and Immunology

## 2018-09-18 ENCOUNTER — Ambulatory Visit (INDEPENDENT_AMBULATORY_CARE_PROVIDER_SITE_OTHER): Payer: BC Managed Care – PPO | Admitting: Allergy and Immunology

## 2018-09-18 VITALS — BP 136/70 | HR 90 | Resp 18

## 2018-09-18 DIAGNOSIS — G43909 Migraine, unspecified, not intractable, without status migrainosus: Secondary | ICD-10-CM

## 2018-09-18 DIAGNOSIS — K219 Gastro-esophageal reflux disease without esophagitis: Secondary | ICD-10-CM

## 2018-09-18 DIAGNOSIS — J3089 Other allergic rhinitis: Secondary | ICD-10-CM | POA: Diagnosis not present

## 2018-09-18 DIAGNOSIS — J4540 Moderate persistent asthma, uncomplicated: Secondary | ICD-10-CM | POA: Diagnosis not present

## 2018-09-18 MED ORDER — MONTELUKAST SODIUM 10 MG PO TABS: 10.0000 mg | ORAL_TABLET | Freq: Every day | ORAL | 5 refills | Status: DC

## 2018-09-18 MED ORDER — RANITIDINE HCL 300 MG PO CAPS: 300.0000 mg | ORAL_CAPSULE | Freq: Every evening | ORAL | 5 refills | Status: DC

## 2018-09-18 NOTE — Progress Notes (Signed)
Follow-up Note  Referring Provider: Marylen Ponto, MD Primary Provider: Marylen Ponto, MD Date of Office Visit: 09/18/2018  Subjective:   Michelle Miles (DOB: 05/20/1971) is a 47 y.o. female [facility] in re-evaluation of the following:  HPI: Tamberlyn returns to this clinic in reevaluation of her asthma and allergic rhinitis and a history of LPR and migraine headache.  Her last visit to this clinic was 02 May 2018.  Over the course of the past 10 days or so she has had a little bit of nasal congestion and sneezing and a slight cough.  She did taper off her nasal steroid at her controller inhaler which she restarted over the course of the past several days.  She is getting better at this point in time regarding this event.  She never had any associated fever or anosmia or ugly nasal discharge or sputum production or chest pain and there was no obvious precipitant giving rise to this issue.  Prior to this point in time she was doing wonderful with her respiratory track and did not require any antibiotic or systemic steroid to treat any type of respiratory tract issue.  She did not require the use of a short acting bronchodilator.  She was not having any cough.  Likewise, her reflux was under excellent control.  She did not have any choking or classic reflux symptoms.  Her headaches once again are under excellent control.  She rarely has a headache at this point in time.  She continues to minimize any caffeine or chocolate consumption.  Since school has started she is averaging out to about 1 caffeinated exposure per day.  She apparently has been having some diffuse arthralgias and she is scheduled to see a rheumatologist in the near future.  Allergies as of 09/18/2018      Reactions   Caffeine Other (See Comments)   Makes pt light headed   Codeine Nausea And Vomiting   Floxin [ofloxacin] Other (See Comments)   Causes hallucinations     Latex Rash   Prednisone Rash   Oral only      Medication List      budesonide-formoterol 80-4.5 MCG/ACT inhaler Commonly known as:  SYMBICORT Inhale two puffs twice daily to prevent cough or wheeze.  Rinse, gargle, and spit after use.   cetirizine 10 MG tablet Commonly known as:  ZYRTEC Take 10 mg by mouth daily.   cholecalciferol 1000 units tablet Commonly known as:  VITAMIN D Take 1,000 Units by mouth daily.   escitalopram 5 MG tablet Commonly known as:  LEXAPRO Take 5 mg by mouth daily.   fluticasone 50 MCG/ACT nasal spray Commonly known as:  FLONASE Place 1 spray into both nostrils daily.   montelukast 10 MG tablet Commonly known as:  SINGULAIR Take 1 tablet (10 mg total) by mouth at bedtime.   pantoprazole 40 MG tablet Commonly known as:  PROTONIX Take 1 tablet (40 mg total) by mouth 2 (two) times daily. Take 30-60 min before first meal of the day   potassium chloride SA 20 MEQ tablet Commonly known as:  K-DUR,KLOR-CON Take 20 mEq by mouth 2 (two) times daily.   PROAIR HFA 108 (90 Base) MCG/ACT inhaler Generic drug:  albuterol Inhale 2 puffs into the lungs every 6 (six) hours as needed for wheezing or shortness of breath.   ranitidine 300 MG capsule Commonly known as:  ZANTAC Take 1 capsule (300 mg  total) by mouth every evening.   triamterene-hydrochlorothiazide 37.5-25 MG tablet Commonly known as:  MAXZIDE-25 Take 1 each (1 tablet total) by mouth daily.   vitamin B-12 100 MCG tablet Commonly known as:  CYANOCOBALAMIN Take 100 mcg by mouth daily.   VITAMIN E PO Take 1 tablet by mouth daily.       Past Medical History:  Diagnosis Date  . Asthma   . Chronic bronchitis (HCC)   . Chronic sinus infection    "used to get them all the time; recently had one after none in 3-4 years" (02/29/2016)  . Family history of adverse reaction to anesthesia    "daughter gets PONV & is hard to wake up"  . Hypokalemia   . Meniere's disease    trated with  maxide  . PONV (postoperative nausea and vomiting)    also "slow to wake"  . Recurrent upper respiratory infection (URI)   . SVT (supraventricular tachycardia) (HCC)     Past Surgical History:  Procedure Laterality Date  . CARPAL TUNNEL RELEASE Right ~ 2008  . COLONOSCOPY N/A 02/03/2013   Procedure: COLONOSCOPY;  Surgeon: Barrie Folk, MD;  Location: [facility] ENDOSCOPY;  Service: Endoscopy;  Laterality: N/A;  . DILATION AND CURETTAGE OF UTERUS  1998   "1 wk after I had my son"  . ELECTROPHYSIOLOGIC STUDY N/A 02/29/2016   Procedure: SVT Ablation;  Surgeon: Will Jorja Loa, MD;  Location: [facility] INVASIVE CV LAB;  Service: Cardiovascular;  Laterality: N/A;  . ENDOMETRIAL ABLATION    . HEMORRHOID SURGERY N/A 02/19/2013   Procedure: PROCEDURE PROLAPSED HEMORRHOIDS;  Surgeon: Mariella Saa, MD;  Location: [facility] ORS;  Service: General;  Laterality: N/A;  . LAPAROSCOPIC CHOLECYSTECTOMY  ~ 2007  . NASAL SEPTUM SURGERY  ~ 2010  . PLANTAR FASCIA RELEASE Right 2000s   right  . SINOSCOPY    . SUPRAVENTRICULAR TACHYCARDIA ABLATION  02/29/2016    Review of systems negative except as noted in HPI / PMHx or noted below:  Review of Systems  Constitutional: Negative.   HENT: Negative.   Eyes: Negative.   Respiratory: Negative.   Cardiovascular: Negative.   Gastrointestinal: Negative.   Genitourinary: Negative.   Musculoskeletal: Negative.   Skin: Negative.   Neurological: Negative.   Endo/Heme/Allergies: Negative.   Psychiatric/Behavioral: Negative.      Objective:   Vitals:   09/18/18 1627  BP: 136/70  Pulse: 90  Resp: 18  SpO2: 98%          Physical Exam  HENT:  Head: Normocephalic.  Right Ear: Tympanic membrane, external ear and ear canal normal.  Left Ear: Tympanic membrane, external ear and ear canal normal.  Nose: Nose normal. No mucosal edema or rhinorrhea.  Mouth/Throat: Uvula is midline, oropharynx is clear and moist and mucous membranes are normal. No oropharyngeal  exudate.  Eyes: Conjunctivae are normal.  Neck: Trachea normal. No tracheal tenderness present. No tracheal deviation present. No thyromegaly present.  Cardiovascular: Normal rate, regular rhythm, S1 normal, S2 normal and normal heart sounds.  No murmur heard. Pulmonary/Chest: Breath sounds normal. No stridor. No respiratory distress. She has no wheezes. She has no rales.  Musculoskeletal: She exhibits no edema.  Lymphadenopathy:       Head (right side): No tonsillar adenopathy present.       Head (left side): No tonsillar adenopathy present.    She has no cervical adenopathy.  Neurological: She is alert.  Skin: No rash noted. She is not diaphoretic. No erythema. Nails show  no clubbing.    Diagnostics:    Spirometry was performed and demonstrated an FEV1 of 2.56 at 89 % of predicted.  The patient had an Asthma Control Test with the following results: ACT Total Score: 22.    Assessment and Plan:   1. Asthma, moderate persistent, well-controlled   2. Other allergic rhinitis   3. LPRD (laryngopharyngeal reflux disease)   4. Migraine syndrome     1. Continue to Treat and prevent headache:   A.  Remain off all forms of caffeine and chocolate  B.  Remain off daily analgesic agents  2.  Continue to Treat and prevent reflux:   A.  Remain off all forms of caffeine and chocolate  B.  Protonix 40 mg twice a day  C.  ranitidine 300 mg in the evening  3.  Continue to Treat and prevent inflammation:   A.  RESTART Flonase 1 spray each nostril 1-2 times a day  B.  RESTART Symbicort 80 - 2 inhalations 1-2 times a day    C.  Montelukast 10 mg daily  4. If needed:   A.  Claritin or Zyrtec 10 mg 1 tablet once a day  B.  Pro Air HFA 2 puffs every 4-6 hours  C.  Nasal saline spray  5.  Return to clinic in February 2020 or earlier if problem    6. Obtain fall flu vaccine  [place] appears to be doing okay.  I have encouraged her to utilize Flonase and Symbicort on a regular basis  even at a relatively low dosage to prevent her from developing significant problems as she goes through the school year.  She has really done very well regarding her upper airway and lower airway and headache and LPR issue on the plan mentioned above and she will contact me if she has difficulty with her respiratory track or headaches in the face of this plan.  If she does well I will see her back in this clinic in February 2020.  Laurette Schimke, MD Allergy / Immunology [facility]

## 2018-09-18 NOTE — Patient Instructions (Addendum)
  1. Continue to Treat and prevent headache:   A.  Remain off all forms of caffeine and chocolate  B.  Remain off daily analgesic agents  2.  Continue to Treat and prevent reflux:   A.  Remain off all forms of caffeine and chocolate  B.  Protonix 40 mg twice a day  C.  ranitidine 300 mg in the evening  3.  Continue to Treat and prevent inflammation:   A.  RESTART Flonase 1 spray each nostril 1-2 times a day  B.  RESTART Symbicort 80 - 2 inhalations 1-2 times a day    C.  Montelukast 10 mg daily  4. If needed:   A.  Claritin or Zyrtec 10 mg 1 tablet once a day  B.  Pro Air HFA 2 puffs every 4-6 hours  C.  Nasal saline spray  5.  Return to clinic in February 2020 or earlier if problem    6. Obtain fall flu vaccine

## 2018-09-19 ENCOUNTER — Encounter: Payer: Self-pay | Admitting: Allergy and Immunology

## 2019-01-06 ENCOUNTER — Other Ambulatory Visit: Payer: Self-pay | Admitting: Allergy and Immunology

## 2019-01-06 DIAGNOSIS — K219 Gastro-esophageal reflux disease without esophagitis: Secondary | ICD-10-CM

## 2019-04-30 ENCOUNTER — Other Ambulatory Visit: Payer: Self-pay

## 2019-04-30 ENCOUNTER — Ambulatory Visit (INDEPENDENT_AMBULATORY_CARE_PROVIDER_SITE_OTHER): Payer: BC Managed Care – PPO | Admitting: Allergy and Immunology

## 2019-04-30 ENCOUNTER — Encounter: Payer: Self-pay | Admitting: Allergy and Immunology

## 2019-04-30 VITALS — BP 130/82 | HR 72 | Resp 16 | Ht 63.5 in

## 2019-04-30 DIAGNOSIS — K219 Gastro-esophageal reflux disease without esophagitis: Secondary | ICD-10-CM

## 2019-04-30 DIAGNOSIS — G43909 Migraine, unspecified, not intractable, without status migrainosus: Secondary | ICD-10-CM | POA: Diagnosis not present

## 2019-04-30 DIAGNOSIS — J3089 Other allergic rhinitis: Secondary | ICD-10-CM

## 2019-04-30 DIAGNOSIS — J4540 Moderate persistent asthma, uncomplicated: Secondary | ICD-10-CM

## 2019-04-30 MED ORDER — MONTELUKAST SODIUM 10 MG PO TABS: 10.0000 mg | ORAL_TABLET | Freq: Every day | ORAL | 5 refills | Status: DC

## 2019-04-30 NOTE — Progress Notes (Signed)
[place] [facility] [place] [facility] [place] - [place] [facility] [place]   Follow-up Note  Referring Provider: Marylen Ponto, MD Primary Provider: Marylen Ponto, MD Date of Office Visit: 04/30/2019  Subjective:   Michelle Miles (DOB: 1971/11/30) is a 48 y.o. female who [facility] in re-evaluation of the following:  HPI: Michelle Miles returns to this clinic in reevaluation of asthma and allergic rhinitis and a history of LPR and a history of migraine headache.  I last saw her in this clinic on 18 September 2018.   Michelle Miles has really done very well with her airway.  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  Her headaches have been under excellent control.  She is very careful about caffeine and chocolate consumption.  Her reflux has been under very good control.  She did obtain the flu vaccine this year.  She has lost 28 pounds voluntarily over the course of the past 6 months.  She did have evaluation with a rheumatologist for her arthralgias and was diagnosed with osteoarthritis and fibromyalgia and given Celebrex which is resulted in very good control of this issue.  Allergies as of 04/30/2019      Reactions   Caffeine Other (See Comments)   Makes pt light headed   Codeine Nausea And Vomiting   Floxin [ofloxacin] Other (See Comments)   Causes hallucinations   Latex Rash   Prednisone Rash   Oral only      Medication List       budesonide-formoterol 80-4.5 MCG/ACT inhaler Commonly  known as:  Symbicort Inhale two puffs twice daily to prevent cough or wheeze.  Rinse, gargle, and spit after use.   cetirizine 10 MG tablet Commonly known as:  ZYRTEC Take 10 mg by mouth daily.   cholecalciferol 1000 units tablet Commonly known as:  VITAMIN D Take 1,000 Units by mouth daily.   escitalopram 5 MG tablet Commonly known as:  LEXAPRO Take 5 mg by mouth daily.   fluticasone 50 MCG/ACT nasal spray Commonly known as:  FLONASE PLACE 1 SPRAY INTO BOTH NOSTRILS DAILY.   montelukast 10 MG tablet Commonly known as:  SINGULAIR Take 1 tablet (10 mg total) by mouth at bedtime.   pantoprazole 40 MG tablet Commonly known as:  PROTONIX TAKE 1 TABLET BY MOUTH 2 TIMES DAILY. TAKE 30-60 MIN BEFORE FIRST MEAL OF THE DAY   potassium chloride SA 20 MEQ tablet Commonly known as:  K-DUR Take 20 mEq by mouth 2 (two) times daily.   ProAir HFA 108 (90 Base) MCG/ACT inhaler Generic drug:  albuterol Inhale 2 puffs into the lungs every 6 (six) hours as needed for wheezing or shortness of breath.   triamterene-hydrochlorothiazide 37.5-25 MG tablet Commonly known as:  MAXZIDE-25 Take 1 each (1 tablet total) by mouth daily.   vitamin B-12 100 MCG tablet Commonly known as:  CYANOCOBALAMIN Take 100 mcg by mouth daily.   VITAMIN E PO Take 1 tablet by mouth daily.  Past Medical History:  Diagnosis Date  . Asthma   . Chronic bronchitis (HCC)   . Chronic sinus infection    "used to get them all the time; recently had one after none in 3-4 years" (02/29/2016)  . Family history of adverse reaction to anesthesia    "daughter gets PONV & is hard to wake up"  . Hypokalemia   . Meniere's disease    trated with maxide  . PONV (postoperative nausea and vomiting)    also "slow to wake"  . Recurrent upper respiratory infection (URI)   . SVT (supraventricular tachycardia) (HCC)     Past Surgical History:  Procedure Laterality Date  . CARPAL TUNNEL RELEASE Right ~ 2008  .  COLONOSCOPY N/A 02/03/2013   Procedure: COLONOSCOPY;  Surgeon: Barrie FolkJohn C Hayes, MD;  [facility];  Service: Endoscopy;  Laterality: N/A;  . DILATION AND CURETTAGE OF UTERUS  1998   "1 wk after I had my son"  . ELECTROPHYSIOLOGIC STUDY N/A 02/29/2016   Procedure: SVT Ablation;  Surgeon: Will Jorja LoaMartin Camnitz, MD;  Location: [facility] INVASIVE CV LAB;  Service: Cardiovascular;  Laterality: N/A;  . ENDOMETRIAL ABLATION    . HEMORRHOID SURGERY N/A 02/19/2013   Procedure: PROCEDURE PROLAPSED HEMORRHOIDS;  Surgeon: Mariella SaaBenjamin T Hoxworth, MD;  Location: [facility] ORS;  Service: General;  Laterality: N/A;  . LAPAROSCOPIC CHOLECYSTECTOMY  ~ 2007  . NASAL SEPTUM SURGERY  ~ 2010  . PLANTAR FASCIA RELEASE Right 2000s   right  . SINOSCOPY    . SUPRAVENTRICULAR TACHYCARDIA ABLATION  02/29/2016    Review of systems negative except as noted in HPI / PMHx or noted below:  Review of Systems  Constitutional: Negative.   HENT: Negative.   Eyes: Negative.   Respiratory: Negative.   Cardiovascular: Negative.   Gastrointestinal: Negative.   Genitourinary: Negative.   Musculoskeletal: Negative.   Skin: Negative.   Neurological: Negative.   Endo/Heme/Allergies: Negative.   Psychiatric/Behavioral: Negative.      Objective:   Vitals:   04/30/19 1546  BP: 130/82  Pulse: 72  Resp: 16  SpO2: 96%   Height: 5' 3.5" (161.3 cm)      Physical Exam Constitutional:      Appearance: She is not diaphoretic.  HENT:     Head: Normocephalic.     Right Ear: Tympanic membrane, ear canal and external ear normal.     Left Ear: Tympanic membrane, ear canal and external ear normal.     Nose: Nose normal. No mucosal edema or rhinorrhea.     Mouth/Throat:     Pharynx: Uvula midline. No oropharyngeal exudate.  Eyes:     Conjunctiva/sclera: Conjunctivae normal.  Neck:     Thyroid: No thyromegaly.     Trachea: Trachea normal. No tracheal tenderness or tracheal deviation.  Cardiovascular:     Rate and Rhythm: Normal rate  and regular rhythm.     Heart sounds: Normal heart sounds, S1 normal and S2 normal. No murmur.  Pulmonary:     Effort: No respiratory distress.     Breath sounds: Normal breath sounds. No stridor. No wheezing or rales.  Lymphadenopathy:     Head:     Right side of head: No tonsillar adenopathy.     Left side of head: No tonsillar adenopathy.     Cervical: No cervical adenopathy.  Skin:    Findings: No erythema or rash.     Nails: There is no clubbing.   Neurological:     Mental Status: She  is alert.     Diagnostics:    Spirometry was performed and demonstrated an FEV1 of 2.65 at 92 % of predicted.    Assessment and Plan:   1. Asthma, moderate persistent, well-controlled   2. Other allergic rhinitis   3. LPRD (laryngopharyngeal reflux disease)   4. Migraine syndrome     1. Continue to Treat and prevent headache:   A.  Remain off all forms of caffeine and chocolate  B.  Remain off daily analgesic agents  2.  Continue to Treat and prevent reflux:   A.  Remain off all forms of caffeine and chocolate  B.  Protonix 40 mg twice a day  3.  Continue to Treat and prevent inflammation:   A.  Flonase 1 spray each nostril 1-2 times a day  B.  Symbicort 80 - 2 inhalations 1-2 times a day    C.  Montelukast 10 mg daily  4. If needed:   A.  Claritin or Zyrtec 10 mg 1 tablet once a day  B.  Pro Air HFA 2 puffs every 4-6 hours  C.  Nasal saline spray  5.  Return to clinic in 6 months or earlier if problem    Michelle Miles appears through be doing quite well on her current plan which includes a combination of anti-inflammatory medications for her airway and therapy directed against reflux.  Assuming she continues to do well with this plan I will see her back in this clinic in 6 months or earlier if there is a problem.  Michelle Schimke, MD Allergy / Immunology [facility]

## 2019-04-30 NOTE — Patient Instructions (Addendum)
  1. Continue to Treat and prevent headache:   A.  Remain off all forms of caffeine and chocolate  B.  Remain off daily analgesic agents  2.  Continue to Treat and prevent reflux:   A.  Remain off all forms of caffeine and chocolate  B.  Protonix 40 mg twice a day  3.  Continue to Treat and prevent inflammation:   A.  Flonase 1 spray each nostril 1-2 times a day  B.  Symbicort 80 - 2 inhalations 1-2 times a day    C.  Montelukast 10 mg daily  4. If needed:   A.  Claritin or Zyrtec 10 mg 1 tablet once a day  B.  Pro Air HFA 2 puffs every 4-6 hours  C.  Nasal saline spray  5.  Return to clinic in 6 months or earlier if problem

## 2019-05-01 ENCOUNTER — Encounter: Payer: Self-pay | Admitting: Allergy and Immunology

## 2019-08-03 ENCOUNTER — Other Ambulatory Visit: Payer: Self-pay | Admitting: Allergy and Immunology

## 2019-08-03 DIAGNOSIS — K219 Gastro-esophageal reflux disease without esophagitis: Secondary | ICD-10-CM

## 2019-09-27 IMAGING — CT CT MAXILLOFACIAL W/O CM
1 series · 15 of 30 positions shown, 19 images · non-contrast
Comparison: 04/12/2017

CLINICAL DATA: Chronic sinusitis

EXAM:
CT MAXILLOFACIAL WITHOUT CONTRAST
TECHNIQUE: Multidetector CT images of the paranasal sinuses were obtained using
the standard protocol without intravenous contrast.

[Series 5: soft tissue · axial · 0.38mm/px · z∈[-166,-58]mm · 15 of 117 slices shown, 19 images]
[im 5/117  brain]
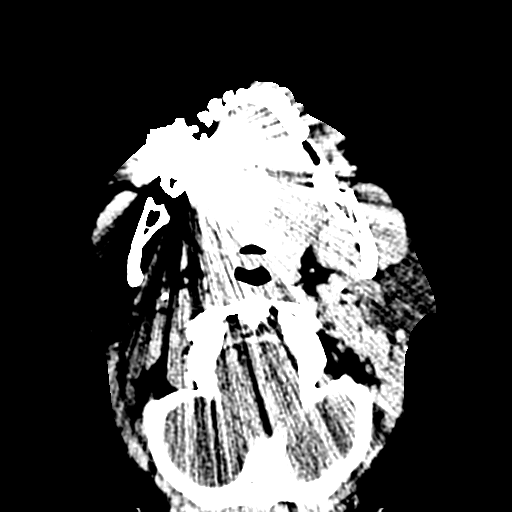
[im 5/117  bone]
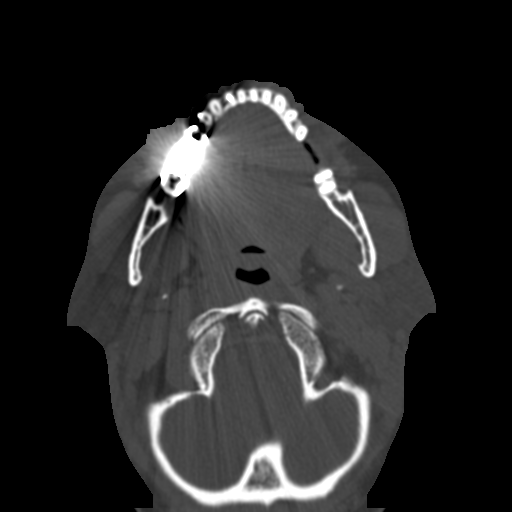
[im 13/117  bone]
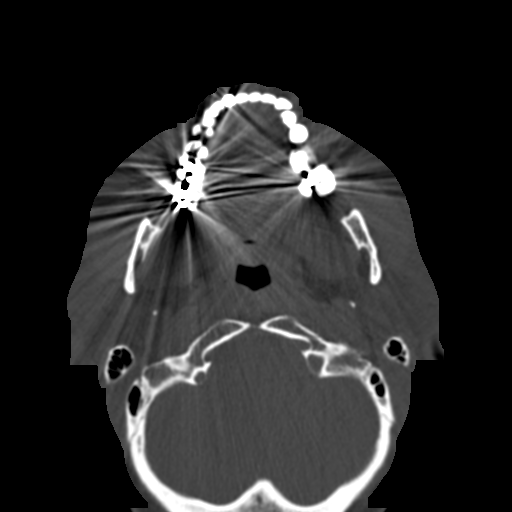
[im 21/117  bone]
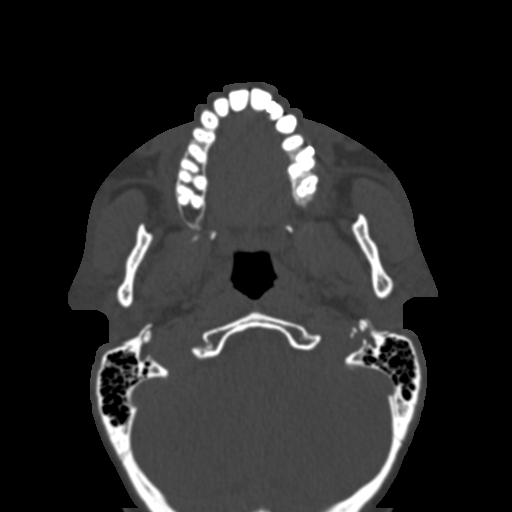
[im 29/117  bone]
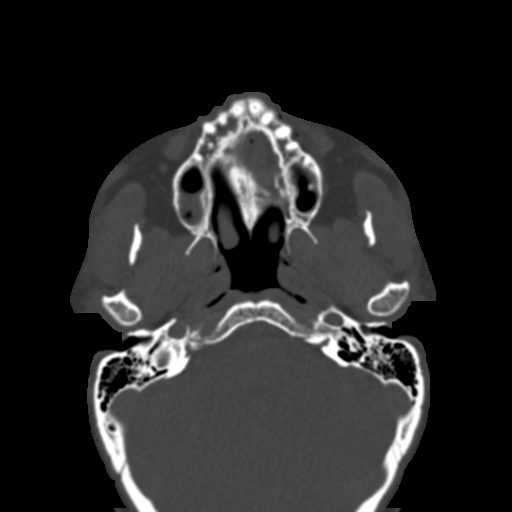
[im 37/117  brain]
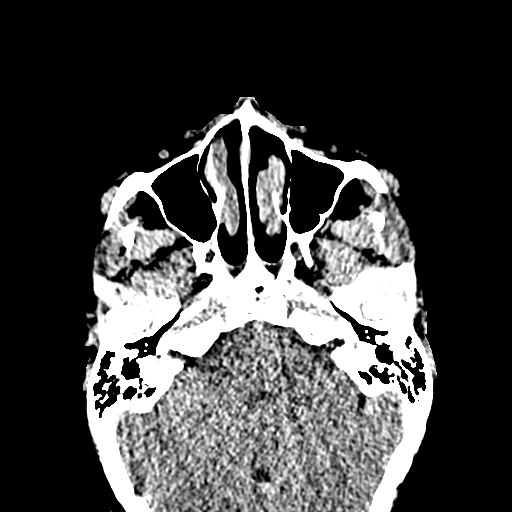
[im 37/117  bone]
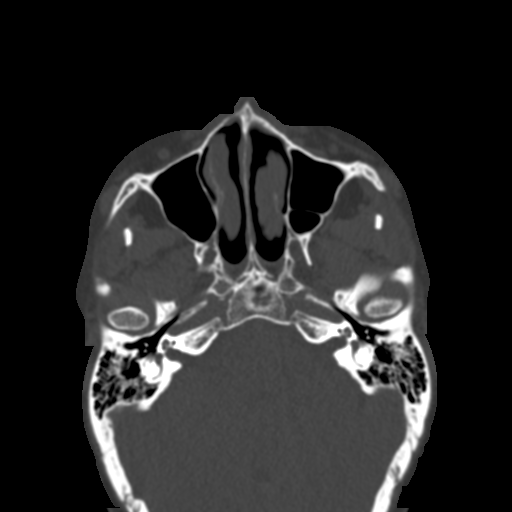
[im 45/117  bone]
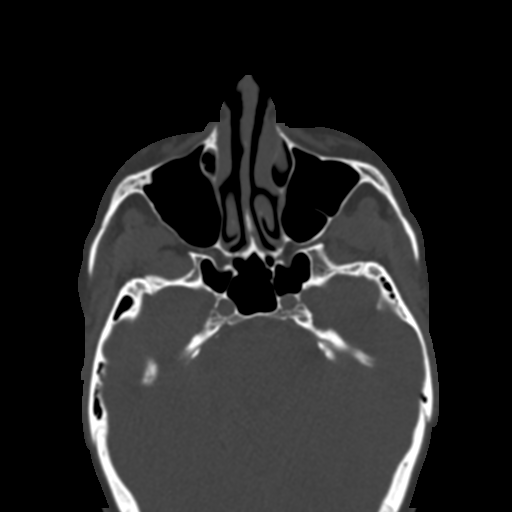
[im 53/117  bone]
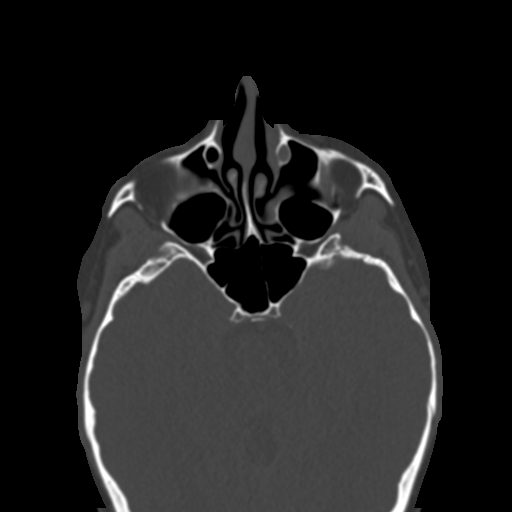
[im 61/117  bone]
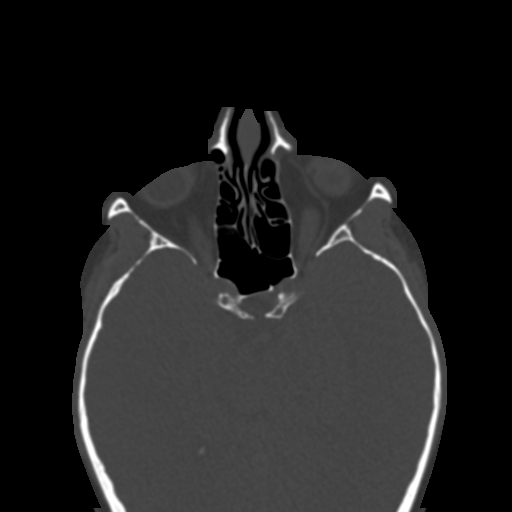
[im 65/117  brain]
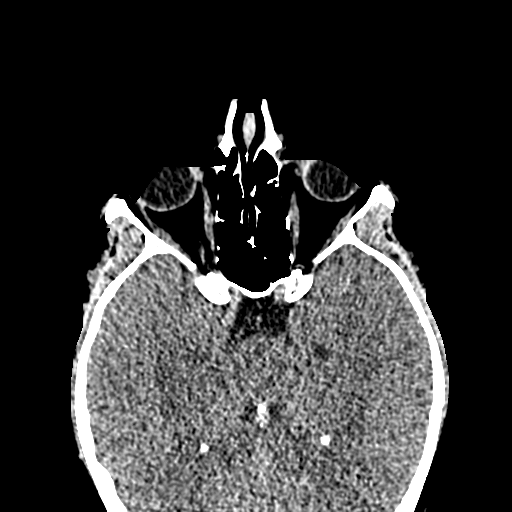
[im 65/117  bone]
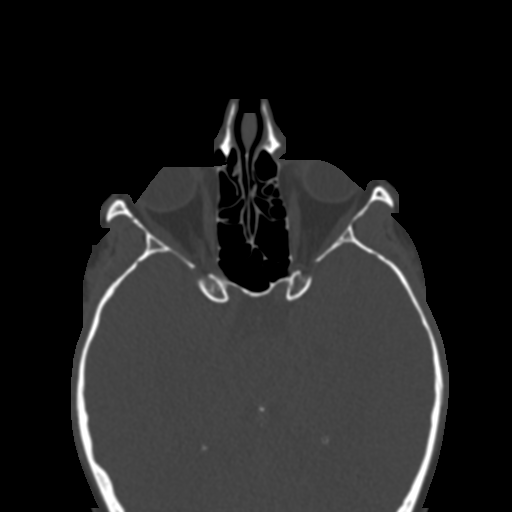
[im 73/117  bone]
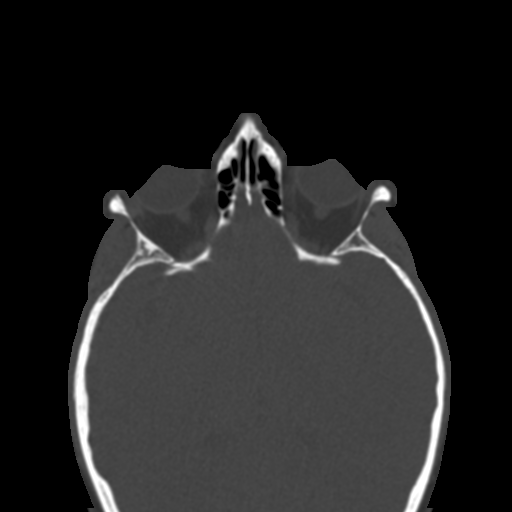
[im 81/117  bone]
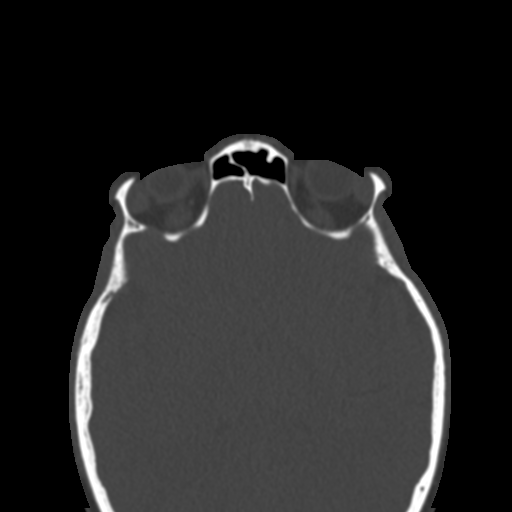
[im 89/117  bone]
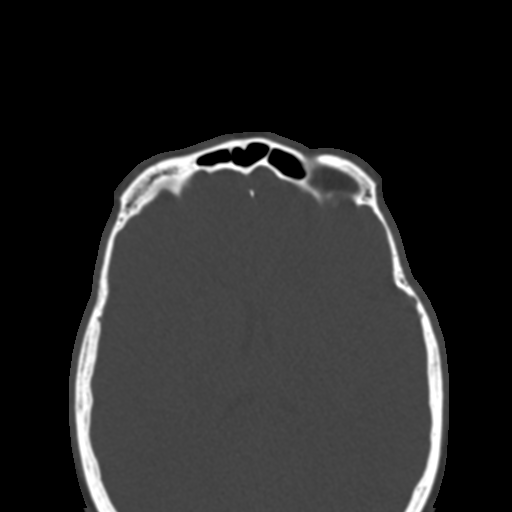
[im 97/117  brain]
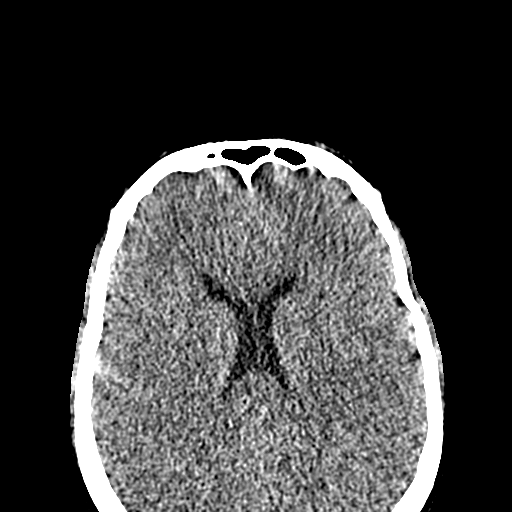
[im 97/117  bone]
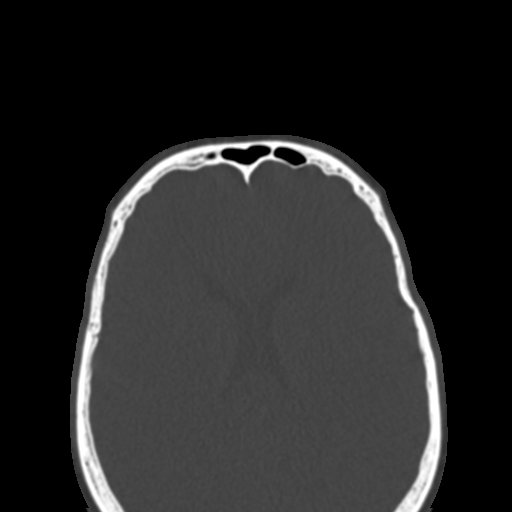
[im 105/117  bone]
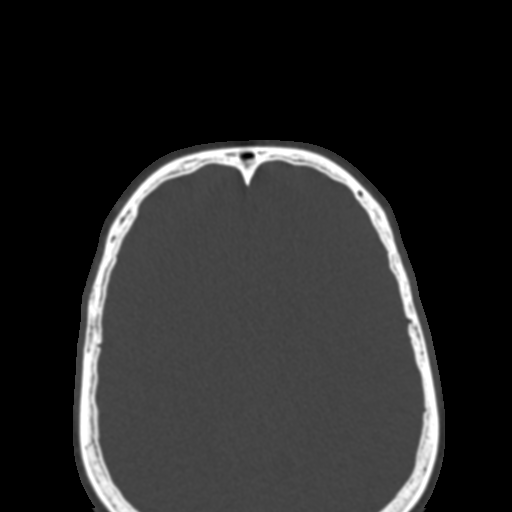
[im 113/117  bone]
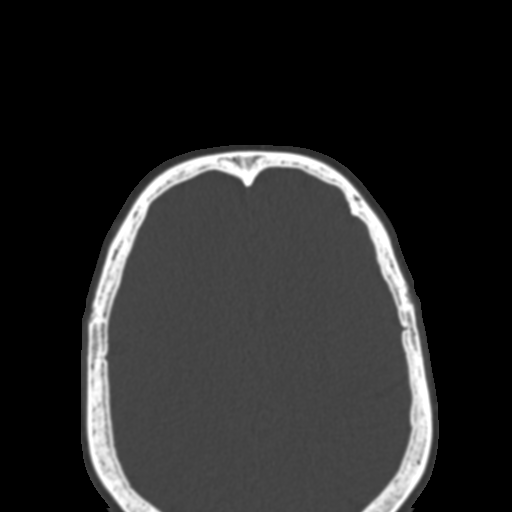

[15 of 30 positions shown; findings below may reference images not displayed]

FINDINGS: Paranasal sinuses:

Frontal: Normally aerated. Patent frontal sinus drainage pathways.

Ethmoid: Normally aerated.

Maxillary: Minimal opacification on the right, inferiorly, without
fluid levels, unchanged. Normally aerated left maxillary sinus.

Sphenoid: Normally aerated. Patent sphenoethmoidal recesses.

Right ostiomeatal unit: Patent.

Left ostiomeatal unit: Patent.

Nasal passages: Patent. 1-2 mm of rightward septal deviation.

Anatomy: No pneumatization superior to anterior ethmoid notches.
Symmetric and intact olfactory grooves and fovea ethmoidalis, K[place]
II (4-7mm). Sellar sphenoid pneumatization pattern.

Other: Orbits and intracranial compartment are unremarkable. Visible
mastoid air cells are normally aerated.
IMPRESSION: Unchanged minimal opacification at the floor of the right maxillary
sinus. Otherwise normally aerated paranasal sinuses. Patent sinus
drainage pathways.

## 2019-10-21 ENCOUNTER — Other Ambulatory Visit: Payer: Self-pay | Admitting: Allergy and Immunology

## 2019-11-03 ENCOUNTER — Ambulatory Visit: Payer: BC Managed Care – PPO | Admitting: Allergy and Immunology

## 2019-11-03 ENCOUNTER — Other Ambulatory Visit: Payer: Self-pay

## 2019-11-03 ENCOUNTER — Encounter: Payer: Self-pay | Admitting: Allergy and Immunology

## 2019-11-03 VITALS — BP 104/80 | HR 76 | Temp 97.7°F | Resp 20

## 2019-11-03 DIAGNOSIS — G43909 Migraine, unspecified, not intractable, without status migrainosus: Secondary | ICD-10-CM | POA: Diagnosis not present

## 2019-11-03 DIAGNOSIS — J3089 Other allergic rhinitis: Secondary | ICD-10-CM | POA: Diagnosis not present

## 2019-11-03 DIAGNOSIS — K219 Gastro-esophageal reflux disease without esophagitis: Secondary | ICD-10-CM

## 2019-11-03 DIAGNOSIS — J4540 Moderate persistent asthma, uncomplicated: Secondary | ICD-10-CM | POA: Diagnosis not present

## 2019-11-03 NOTE — Progress Notes (Signed)
[place] - [place]   Follow-up Note  Referring Provider: Marylen PontoHolt, Lynley S, MD Primary Provider: Marylen PontoHolt, Lynley S, MD Date of Office Visit: 11/03/2019  Subjective:   Michelle Miles (DOB: Aug 20, 1971) is a 48 y.o. female who [facility] in re-evaluation of the following:  HPI: Michelle Miles returns to this clinic in reevaluation of asthma and allergic rhinitis and LPR and history of migraine.  I last saw her in this clinic on 18 September 2018.  Her asthma has been under excellent control.  She has not required a systemic steroid to treat an exacerbation.  She rarely uses a short acting bronchodilator while she continues on Symbicort mostly 1 time per day and montelukast consistently.  She has really done very well with her nose and intermittently and rarely uses any nasal steroid.  Apparently she has had 1 sinus infection in the past year in September 2020 which required an antibiotic.  Apparently that sinus infection was associated with otitis media as well.  She does not have any problems with headaches.  She drinks a very small amount of caffeine in the morning.  Her reflux is under very good control while using proton pump inhibitor twice a day.  If she misses a single dose she does develop GI symptoms.  Apparently she has a injured right hip and there is talk at this point in time about her having her undergo a hip surgery when she is a little bit older.  There is an attempt at this point to have her use injectable steroids into that hip.  Her diffuse arthralgias was evaluated by rheumatology and is being treated successfully with Celebrex.  She did obtain the flu vaccine this year.  Allergies as of 11/03/2019      Reactions   Caffeine Other (See Comments)   Makes pt light headed   Codeine Nausea And Vomiting   Floxin [ofloxacin] Other (See Comments)   Causes hallucinations   Latex Rash   Prednisone  Rash   Oral only      Medication List      budesonide-formoterol 80-4.5 MCG/ACT inhaler Commonly known as: Symbicort Inhale two puffs twice daily to prevent cough or wheeze.  Rinse, gargle, and spit after use.   celecoxib 200 MG capsule Commonly known as: CELEBREX Take 200 mg by mouth daily.   cetirizine 10 MG tablet Commonly known as: ZYRTEC Take 10 mg by mouth daily.   cholecalciferol 1000 units tablet Commonly known as: VITAMIN D Take 1,000 Units by mouth daily.   doxycycline 20 MG tablet Commonly known as: PERIOSTAT Take 20 mg by mouth 2 (two) times daily.   escitalopram 5 MG tablet Commonly known as: LEXAPRO Take 5 mg by mouth daily.   fluticasone 50 MCG/ACT nasal spray Commonly known as: FLONASE PLACE 1 SPRAY INTO BOTH NOSTRILS DAILY.   montelukast 10 MG tablet Commonly known as: SINGULAIR TAKE 1 TABLET BY MOUTH EVERYDAY AT BEDTIME   pantoprazole 40 MG tablet Commonly known as: PROTONIX TAKE 1 TABLET BY MOUTH 2 TIMES DAILY. TAKE 30-60 MIN BEFORE FIRST MEAL OF THE DAY   potassium chloride SA 20 MEQ tablet Commonly known as: KLOR-CON Take 20 mEq by mouth 2 (two) times daily.   ProAir HFA 108 (90 Base) MCG/ACT inhaler Generic drug: albuterol Inhale 2 puffs into the lungs every 6 (six) hours as needed for wheezing or shortness of breath.   Saxenda 18 MG/3ML Sopn  Generic drug: Liraglutide -Weight Management Inject 1 mg into the skin daily.   triamterene-hydrochlorothiazide 37.5-25 MG tablet Commonly known as: MAXZIDE-25 Take 1 each (1 tablet total) by mouth daily.   vitamin B-12 100 MCG tablet Commonly known as: CYANOCOBALAMIN Take 100 mcg by mouth daily.   VITAMIN E PO Take 1 tablet by mouth daily.       Past Medical History:  Diagnosis Date  . Asthma   . Chronic bronchitis (HCC)   . Chronic sinus infection    "used to get them all the time; recently had one after none in 3-4 years" (02/29/2016)  . Family history of adverse reaction to  anesthesia    "daughter gets PONV & is hard to wake up"  . Hypokalemia   . Meniere's disease    trated with maxide  . PONV (postoperative nausea and vomiting)    also "slow to wake"  . Recurrent upper respiratory infection (URI)   . SVT (supraventricular tachycardia) (HCC)     Past Surgical History:  Procedure Laterality Date  . CARPAL TUNNEL RELEASE Right ~ 2008  . COLONOSCOPY N/A 02/03/2013   Procedure: COLONOSCOPY;  Surgeon: Barrie Folk, MD;  Location: [facility] ENDOSCOPY;  Service: Endoscopy;  Laterality: N/A;  . DILATION AND CURETTAGE OF UTERUS  1998   "1 wk after I had my son"  . ELECTROPHYSIOLOGIC STUDY N/A 02/29/2016   Procedure: SVT Ablation;  Surgeon: Will Jorja Loa, MD;  Location: [facility] INVASIVE CV LAB;  Service: Cardiovascular;  Laterality: N/A;  . ENDOMETRIAL ABLATION    . HEMORRHOID SURGERY N/A 02/19/2013   Procedure: PROCEDURE PROLAPSED HEMORRHOIDS;  Surgeon: Mariella Saa, MD;  Location: [facility] ORS;  Service: General;  Laterality: N/A;  . LAPAROSCOPIC CHOLECYSTECTOMY  ~ 2007  . NASAL SEPTUM SURGERY  ~ 2010  . PLANTAR FASCIA RELEASE Right 2000s   right  . SINOSCOPY    . SUPRAVENTRICULAR TACHYCARDIA ABLATION  02/29/2016    Review of systems negative except as noted in HPI / PMHx or noted below:  Review of Systems  Constitutional: Negative.   HENT: Negative.   Eyes: Negative.   Respiratory: Negative.   Cardiovascular: Negative.   Gastrointestinal: Negative.   Genitourinary: Negative.   Musculoskeletal: Negative.   Skin: Negative.   Neurological: Negative.   Endo/Heme/Allergies: Negative.   Psychiatric/Behavioral: Negative.      Objective:   Vitals:   11/03/19 1616  BP: 104/80  Pulse: 76  Resp: 20  Temp: 97.7 F (36.5 C)  SpO2: 99%          Physical Exam Constitutional:      Appearance: She is not diaphoretic.  HENT:     Head: Normocephalic.     Right Ear: Tympanic membrane, ear canal and external ear normal.     Left Ear: Tympanic membrane,  ear canal and external ear normal.     Nose: Nose normal. No mucosal edema or rhinorrhea.     Mouth/Throat:     Pharynx: Uvula midline. No oropharyngeal exudate.  Eyes:     Conjunctiva/sclera: Conjunctivae normal.  Neck:     Thyroid: No thyromegaly.     Trachea: Trachea normal. No tracheal tenderness or tracheal deviation.  Cardiovascular:     Rate and Rhythm: Normal rate and regular rhythm.     Heart sounds: Normal heart sounds, S1 normal and S2 normal. No murmur.  Pulmonary:     Effort: No respiratory distress.     Breath sounds: Normal breath sounds. No stridor. No wheezing  or rales.  Lymphadenopathy:     Head:     Right side of head: No tonsillar adenopathy.     Left side of head: No tonsillar adenopathy.     Cervical: No cervical adenopathy.  Skin:    Findings: No erythema or rash.     Nails: There is no clubbing.   Neurological:     Mental Status: She is alert.     Diagnostics:    Spirometry was performed and demonstrated an FEV1 of 2.65 at 92 % of predicted.  Assessment and Plan:   1. Asthma, moderate persistent, well-controlled   2. Other allergic rhinitis   3. LPRD (laryngopharyngeal reflux disease)   4. Migraine syndrome     1. Continue to Treat and prevent headache:   A.  Remain off all forms of caffeine and chocolate  2.  Continue to Treat and prevent reflux:   A.  Remain off all forms of caffeine and chocolate  B.  Protonix 40 mg twice a day  3.  Continue to Treat and prevent inflammation:   A.  Symbicort 80 - 2 inhalations 1-2 times a day    B.  Montelukast 10 mg daily  C.  Can add Flonase 1-2 sprays each nostril  4. If needed:   A.  Claritin or Zyrtec 10 mg 1 tablet once a day  B.  Pro Air HFA 2 puffs every 4-6 hours  C.  Nasal saline spray  5.  Return to clinic in 6 months or earlier if problem    6. Obtain Covid vaccine when available  Annalyce really appears to be doing quite well on her current medical plan.  She understands her  disease state and understands the appropriate use of her medications and appropriate dosing of her medications.  She will remain on therapy directed against respiratory tract inflammation and reflux as noted above and if she continues to do well I will see her back in his clinic in 6 months or earlier if there is a problem.  Allena Katz, MD Allergy / Immunology [place]

## 2019-11-03 NOTE — Patient Instructions (Signed)
  1. Continue to Treat and prevent headache:   A.  Remain off all forms of caffeine and chocolate  2.  Continue to Treat and prevent reflux:   A.  Remain off all forms of caffeine and chocolate  B.  Protonix 40 mg twice a day  3.  Continue to Treat and prevent inflammation:   A.  Symbicort 80 - 2 inhalations 1-2 times a day    B.  Montelukast 10 mg daily  C.  Can add Flonase 1-2 sprays each nostril  4. If needed:   A.  Claritin or Zyrtec 10 mg 1 tablet once a day  B.  Pro Air HFA 2 puffs every 4-6 hours  C.  Nasal saline spray  5.  Return to clinic in 6 months or earlier if problem    6. Obtain Covid vaccine when available

## 2019-11-04 ENCOUNTER — Encounter: Payer: Self-pay | Admitting: Allergy and Immunology

## 2019-11-09 ENCOUNTER — Other Ambulatory Visit: Payer: Self-pay | Admitting: Allergy and Immunology

## 2019-11-09 DIAGNOSIS — K219 Gastro-esophageal reflux disease without esophagitis: Secondary | ICD-10-CM

## 2020-01-05 ENCOUNTER — Other Ambulatory Visit: Payer: Self-pay | Admitting: Allergy and Immunology

## 2020-02-23 HISTORY — PX: CARPAL TUNNEL RELEASE: SHX101

## 2020-02-23 HISTORY — PX: TENDON REPAIR: SHX5111

## 2020-06-30 ENCOUNTER — Other Ambulatory Visit: Payer: Self-pay | Admitting: Allergy and Immunology

## 2020-07-25 ENCOUNTER — Other Ambulatory Visit: Payer: Self-pay | Admitting: Allergy and Immunology

## 2020-08-09 ENCOUNTER — Other Ambulatory Visit: Payer: Self-pay | Admitting: *Deleted

## 2020-08-09 MED ORDER — MONTELUKAST SODIUM 10 MG PO TABS: ORAL_TABLET | ORAL | 0 refills | Status: DC

## 2020-08-11 ENCOUNTER — Encounter: Payer: Self-pay | Admitting: Allergy and Immunology

## 2020-08-11 ENCOUNTER — Other Ambulatory Visit: Payer: Self-pay

## 2020-08-11 ENCOUNTER — Ambulatory Visit (INDEPENDENT_AMBULATORY_CARE_PROVIDER_SITE_OTHER): Payer: BC Managed Care – PPO | Admitting: Allergy and Immunology

## 2020-08-11 VITALS — BP 108/72 | HR 80 | Resp 18

## 2020-08-11 DIAGNOSIS — G43909 Migraine, unspecified, not intractable, without status migrainosus: Secondary | ICD-10-CM | POA: Diagnosis not present

## 2020-08-11 DIAGNOSIS — K219 Gastro-esophageal reflux disease without esophagitis: Secondary | ICD-10-CM | POA: Diagnosis not present

## 2020-08-11 DIAGNOSIS — J4540 Moderate persistent asthma, uncomplicated: Secondary | ICD-10-CM | POA: Diagnosis not present

## 2020-08-11 DIAGNOSIS — J3089 Other allergic rhinitis: Secondary | ICD-10-CM

## 2020-08-11 MED ORDER — MONTELUKAST SODIUM 10 MG PO TABS: ORAL_TABLET | ORAL | 1 refills | Status: DC

## 2020-08-11 NOTE — Progress Notes (Signed)
Michelle Miles - Michelle Miles - Michelle Miles - Michelle Miles - Michelle Miles   Follow-up Note  Referring Provider: Marylen Ponto, MD Primary Provider: Marylen Ponto, MD Date of Office Visit: 08/11/2020  Subjective:   Michelle Miles (DOB: 02-01-1971) is a 49 y.o. female who [facility]2020 in re-evaluation of the following:  HPI: Michelle Miles returns to this clinic in evaluation of asthma and allergic rhinitis and LPR and history of migraine.  I last saw her in this clinic on 03 November 2019.  She has really done well with her asthma and has had no significant issue with her lower respiratory tract and rarely uses a short acting bronchodilator and has not required a systemic steroid to treat an exacerbation while she continues to use Symbicort.  She has done very well with her upper airway as well and has not required an antibiotic to treat an episode of sinusitis but since she is run out of montelukast over the course of the past several days and has not been using her nasal steroid she has developed a little bit of stuffiness and facial fullness.  Her reflux is under very good control at this Miles in time and she has no problems with her throat while using a proton Michelle inhibitor.  She has not been having any headaches while dramatically limiting the amount of caffeine she consumes.  Since I have seen her in this clinic she has required a surgery on her left elbow and left carpal tunnel and she has had an injection into her right hip.  Currently she is doing relatively well with all of these musculoskeletal issues.  She has fear and skepticism about obtaining the Covid vaccine.  Allergies as of 08/11/2020      Reactions   Caffeine Other (See Comments)   Makes pt light headed   Codeine Nausea And Vomiting   Floxin [ofloxacin] Other (See Comments)   Causes hallucinations   Latex Rash   Prednisone Rash   Oral only      Medication List      budesonide-formoterol 80-4.5 MCG/ACT inhaler Commonly known as: Symbicort Inhale two puffs twice daily to prevent cough or wheeze.  Rinse, gargle, and spit after use.   cetirizine 10 MG tablet Commonly known as: ZYRTEC Take 10 mg by mouth daily.   cholecalciferol 1000 units tablet Commonly known as: VITAMIN D Take 1,000 Units by mouth daily.   doxycycline 20 MG tablet Commonly known as: PERIOSTAT Take 20 mg by mouth 2 (two) times daily.   escitalopram 10 MG tablet Commonly known as: LEXAPRO Take 5 mg by mouth daily.   fluticasone 50 MCG/ACT nasal spray Commonly known as: FLONASE PLACE 1 SPRAY INTO BOTH NOSTRILS DAILY.   meloxicam 15 MG tablet Commonly known as: MOBIC Take 15 mg by mouth daily.   montelukast 10 MG tablet Commonly known as: SINGULAIR Take one tablet once daily   pantoprazole 40 MG tablet Commonly known as: PROTONIX TAKE 1 TABLET BY MOUTH 2 TIMES DAILY. TAKE 30-60 MIN BEFORE FIRST MEAL OF THE DAY   potassium chloride SA 20 MEQ tablet Commonly known as: KLOR-CON Take 20 mEq by mouth 2 (two) times daily.   ProAir HFA 108 (90 Base) MCG/ACT inhaler Generic drug: albuterol Inhale 2 puffs into the lungs every 6 (six) hours as needed for wheezing or shortness of breath.   triamterene-hydrochlorothiazide 37.5-25 MG tablet Commonly known as: MAXZIDE-25 Take 1 each (1 tablet total) by mouth daily.  TURMERIC PO Take by mouth.   vitamin B-12 100 MCG tablet Commonly known as: CYANOCOBALAMIN Take 100 mcg by mouth daily.   VITAMIN E PO Take 1 tablet by mouth daily.       Past Medical History:  Diagnosis Date  . Asthma   . Chronic bronchitis (HCC)   . Chronic sinus infection    "used to get them all the time; recently had one after none in 3-4 years" (02/29/2016)  . Family history of adverse reaction to anesthesia    "daughter gets PONV & is hard to wake up"  . Hypokalemia   . Meniere's disease    trated with maxide  . PONV (postoperative nausea  and vomiting)    also "slow to wake"  . Recurrent upper respiratory infection (URI)   . SVT (supraventricular tachycardia) (HCC)     Past Surgical History:  Procedure Laterality Date  . CARPAL TUNNEL RELEASE Right ~ 2008  . CARPAL TUNNEL RELEASE Left 02/2020  . COLONOSCOPY N/A 02/03/2013   Procedure: COLONOSCOPY;  Surgeon: Barrie Folk, MD;  Location[facility] ENDOSCOPY;  Service: Endoscopy;  Laterality: N/A;  . DILATION AND CURETTAGE OF UTERUS  1998   "1 wk after I had my son"  . ELECTROPHYSIOLOGIC STUDY N/A 02/29/2016   Procedure: SVT Ablation;  Surgeon: Will Jorja Loa, MD;  Location: [facility] INVASIVE CV LAB;  Service: Cardiovascular;  Laterality: N/A;  . ENDOMETRIAL ABLATION    . HEMORRHOID SURGERY N/A 02/19/2013   Procedure: PROCEDURE PROLAPSED HEMORRHOIDS;  Surgeon: Mariella Saa, MD;  Location: [facility] ORS;  Service: General;  Laterality: N/A;  . LAPAROSCOPIC CHOLECYSTECTOMY  ~ 2007  . NASAL SEPTUM SURGERY  ~ 2010  . PLANTAR FASCIA RELEASE Right 2000s   right  . SINOSCOPY    . SUPRAVENTRICULAR TACHYCARDIA ABLATION  02/29/2016  . TENDON REPAIR  02/2020    Review of systems negative except as noted in HPI / PMHx or noted below:  Review of Systems  Constitutional: Negative.   HENT: Negative.   Eyes: Negative.   Respiratory: Negative.   Cardiovascular: Negative.   Gastrointestinal: Negative.   Genitourinary: Negative.   Musculoskeletal: Negative.   Skin: Negative.   Neurological: Negative.   Endo/Heme/Allergies: Negative.   Psychiatric/Behavioral: Negative.      Objective:   Vitals:   08/11/20 1650  BP: 108/72  Pulse: 80  Resp: 18  SpO2: 96%          Physical Exam Constitutional:      Appearance: She is not diaphoretic.  HENT:     Head: Normocephalic.     Right Ear: Tympanic membrane, ear canal and external ear normal.     Left Ear: Tympanic membrane, ear canal and external ear normal.     Nose: Nose normal. No mucosal edema or rhinorrhea.     Mouth/Throat:      Pharynx: Uvula midline. No oropharyngeal exudate.  Eyes:     Conjunctiva/sclera: Conjunctivae normal.  Neck:     Thyroid: No thyromegaly.     Trachea: Trachea normal. No tracheal tenderness or tracheal deviation.  Cardiovascular:     Rate and Rhythm: Normal rate and regular rhythm.     Heart sounds: Normal heart sounds, S1 normal and S2 normal. No murmur heard.   Pulmonary:     Effort: No respiratory distress.     Breath sounds: Normal breath sounds. No stridor. No wheezing or rales.  Lymphadenopathy:     Head:     Right side of head:  No tonsillar adenopathy.     Left side of head: No tonsillar adenopathy.     Cervical: No cervical adenopathy.  Skin:    Findings: No erythema or rash.     Nails: There is no clubbing.  Neurological:     Mental Status: She is alert.     Diagnostics:    Spirometry was performed and demonstrated an FEV1 of 2.80 at 99 % of predicted.  Assessment and Plan:   1. Asthma, moderate persistent, well-controlled   2. Other allergic rhinitis   3. LPRD (laryngopharyngeal reflux disease)   4. Migraine syndrome     1. Continue to Treat and prevent headache:   A.  Remain off all forms of caffeine and chocolate  2.  Continue to Treat and prevent reflux:   A.  Remain off all forms of caffeine and chocolate  B.  Protonix 40 mg 1-2 times per day  3.  Continue to Treat and prevent inflammation:   A.  Symbicort 80 - 2 inhalations 1-2 times a day    B.  Montelukast 10 mg - 1 tablet 1 time per day  C.  Flonase - 1-2 sprays each nostril 1 time per day  4. If needed:   A.  Claritin or Zyrtec 10 mg 1 tablet once a day  B.  Pro Air HFA 2 puffs every 4-6 hours  C.  Nasal saline spray  5.  Return to clinic in 6 months or earlier if problem    6. Obtain fall flu vaccine  Sahasra appears to be doing relatively well regarding all of her airway issue while utilizing medical therapy directed against both respiratory tract inflammation and reflux induced  respiratory disease and her headaches are under very good control while remaining away from caffeine.  She will continue on this plan and I will see her Miles in this clinic in 6 months or earlier if there is a problem.  I was unable to convince her to obtain a Covid vaccine after a prolonged discussion today concerning this issue.  I did inform her that should she become sick with Covid she would be eligible for a anti-RBD monoclonal antibody therapy.  Laurette Schimke, MD Allergy / Immunology [facility]

## 2020-08-11 NOTE — Patient Instructions (Addendum)
  1. Continue to Treat and prevent headache:   A.  Remain off all forms of caffeine and chocolate  2.  Continue to Treat and prevent reflux:   A.  Remain off all forms of caffeine and chocolate  B.  Protonix 40 mg 1-2 times per day  3.  Continue to Treat and prevent inflammation:   A.  Symbicort 80 - 2 inhalations 1-2 times a day    B.  Montelukast 10 mg - 1 tablet 1 time per day  C.  Flonase - 1-2 sprays each nostril 1 time per day  4. If needed:   A.  Claritin or Zyrtec 10 mg 1 tablet once a day  B.  Pro Air HFA 2 puffs every 4-6 hours  C.  Nasal saline spray  5.  Return to clinic in 6 months or earlier if problem    6. Obtain fall flu vaccine

## 2020-08-12 ENCOUNTER — Encounter: Payer: Self-pay | Admitting: Allergy and Immunology

## 2020-10-28 ENCOUNTER — Encounter: Payer: Self-pay | Admitting: *Deleted

## 2020-10-28 ENCOUNTER — Encounter: Payer: Self-pay | Admitting: Cardiology

## 2020-10-28 DIAGNOSIS — K219 Gastro-esophageal reflux disease without esophagitis: Secondary | ICD-10-CM | POA: Insufficient documentation

## 2020-10-28 DIAGNOSIS — H8109 Meniere's disease, unspecified ear: Secondary | ICD-10-CM | POA: Insufficient documentation

## 2020-10-28 DIAGNOSIS — J45909 Unspecified asthma, uncomplicated: Secondary | ICD-10-CM | POA: Insufficient documentation

## 2020-10-28 DIAGNOSIS — J069 Acute upper respiratory infection, unspecified: Secondary | ICD-10-CM | POA: Insufficient documentation

## 2020-10-28 DIAGNOSIS — J329 Chronic sinusitis, unspecified: Secondary | ICD-10-CM | POA: Insufficient documentation

## 2020-10-28 DIAGNOSIS — J42 Unspecified chronic bronchitis: Secondary | ICD-10-CM | POA: Insufficient documentation

## 2020-10-28 DIAGNOSIS — Z9889 Other specified postprocedural states: Secondary | ICD-10-CM | POA: Insufficient documentation

## 2020-10-28 DIAGNOSIS — E876 Hypokalemia: Secondary | ICD-10-CM | POA: Insufficient documentation

## 2020-10-28 DIAGNOSIS — Z8489 Family history of other specified conditions: Secondary | ICD-10-CM | POA: Insufficient documentation

## 2020-11-01 ENCOUNTER — Encounter: Payer: Self-pay | Admitting: Cardiology

## 2020-11-01 ENCOUNTER — Other Ambulatory Visit: Payer: Self-pay

## 2020-11-01 ENCOUNTER — Ambulatory Visit (INDEPENDENT_AMBULATORY_CARE_PROVIDER_SITE_OTHER): Payer: BC Managed Care – PPO | Admitting: Cardiology

## 2020-11-01 ENCOUNTER — Encounter: Payer: Self-pay | Admitting: *Deleted

## 2020-11-01 VITALS — BP 112/74 | HR 86 | Ht 64.0 in | Wt 215.0 lb

## 2020-11-01 DIAGNOSIS — R002 Palpitations: Secondary | ICD-10-CM

## 2020-11-01 DIAGNOSIS — R0789 Other chest pain: Secondary | ICD-10-CM

## 2020-11-01 NOTE — Patient Instructions (Addendum)
Medication Instructions:  Your physician recommends that you continue on your current medications as directed. Please refer to the Current Medication list given to you today.  *If you need a refill on your cardiac medications before your next appointment, please call your pharmacy*   Lab Work: None ordered   Testing/Procedures: Your physician has requested that you have en exercise stress myoview. For further information please visit [email or web address] Please follow instruction sheet, as given.   Follow-Up: At [facility], you and your health needs are our priority.  As part of our continuing mission to provide you with exceptional heart care, we have created designated Provider Care Teams.  These Care Teams include your primary Cardiologist (physician) and Advanced Practice Providers (APPs -  Physician Assistants and Nurse Practitioners) who all work together to provide you with the care you need, when you need it.  We recommend signing up for the patient portal called "MyChart".  Sign up information is provided on this After Visit Summary.  MyChart is used to connect with patients for Virtual Visits (Telemedicine).  Patients are able to view lab/test results, encounter notes, upcoming appointments, etc.  Non-urgent messages can be sent to your provider as well.   To learn more about what you can do with MyChart, go to [email or web address].    Your next appointment:    to be determined  The format for your next appointment:   In Person  Provider:   Loman Brooklyn, MD    Thank you for choosing [facility]!!   Dory Horn, RN [phone]   Other Instructions

## 2020-11-01 NOTE — Progress Notes (Signed)
Electrophysiology Office Note   Date:  11/01/2020   ID:  Michelle Miles, DOB [birth year], MRN [id number]  PCP:  Marylen Ponto, MD  Primary Electrophysiologist:  Unknown Flannigan Jorja Loa, MD    No chief complaint on file.    History of Present Illness: Michelle Miles is a 49 y.o. female who presents today for electrophysiology evaluation.   Patient is being referred for the evaluation of palpitations by Valentino Hue.    In 2016 she presented to the emergency room with palpitations.  She had been having intermittent fluttering.  She wore a Holter monitor that showed no evidence of SVT.  She had an EP study 02/29/2016 which was noninducible for arrhythmia.  Today, denies symptoms of  orthopnea, PND, lower extremity edema, claudication, dizziness, presyncope, syncope, bleeding, or neurologic sequela. The patient is tolerating medications without difficulties.  Over the last month and a half, she has had worsening palpitations, shortness of breath, and chest pressure.  She says that the chest pressure occurs at all times but does occur more often when she is exerting herself.  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  She says that the chest pressure occurs and feels like an elephant sitting on her chest.  She said that recently she climbed a flight of stairs and had to stop at the top of the flight due to pressure and shortness of breath.   Past Medical History:  Diagnosis Date  . Asthma   . Chronic bronchitis (HCC)   . Chronic sinus infection    "used to get them all the time; recently had one after none in 3-4 years" (02/29/2016)  . Family history of adverse reaction to anesthesia    "daughter gets PONV & is hard to wake up"  . GERD (gastroesophageal reflux disease)   . Hypokalemia   . IBS (irritable bowel syndrome)   . Meniere's disease    trated with maxide  . PONV (postoperative nausea and vomiting)    also "slow to wake"  . Recurrent upper respiratory  infection (URI)   . SVT (supraventricular tachycardia) (HCC)    Past Surgical History:  Procedure Laterality Date  . CARPAL TUNNEL RELEASE Right ~ 2008  . CARPAL TUNNEL RELEASE Left 02/2020  . CHOLECYSTECTOMY    . COLONOSCOPY N/A 02/03/2013   Procedure: COLONOSCOPY;  Surgeon: Barrie Folk, MD;  Location: [facility] ENDOSCOPY;  Service: Endoscopy;  Laterality: N/A;  . DILATION AND CURETTAGE OF UTERUS  1998   "1 wk after I had my son"  . ELECTROPHYSIOLOGIC STUDY N/A 02/29/2016   Procedure: SVT Ablation;  Surgeon: Jamareon Shimel Jorja Loa, MD;  Location: [facility] INVASIVE CV LAB;  Service: Cardiovascular;  Laterality: N/A;  . ENDOMETRIAL ABLATION    . HEMORRHOID SURGERY N/A 02/19/2013   Procedure: PROCEDURE PROLAPSED HEMORRHOIDS;  Surgeon: Mariella Saa, MD;  Location: [facility] ORS;  Service: General;  Laterality: N/A;  . LAPAROSCOPIC CHOLECYSTECTOMY  ~ 2007  . NASAL SEPTUM SURGERY  ~ 2010  . PLANTAR FASCIA RELEASE Right 2000s   right  . SINOSCOPY    . SUPRAVENTRICULAR TACHYCARDIA ABLATION  02/29/2016  . TENDON REPAIR  02/2020     Current Outpatient Medications  Medication Sig Dispense Refill  . budesonide-formoterol (SYMBICORT) 80-4.5 MCG/ACT inhaler Inhale two puffs twice daily to prevent cough or wheeze.  Rinse, gargle, and spit after use. 1 Inhaler 5  . calcium carbonate (OSCAL) 1500 (600 Ca) MG TABS tablet Take 600 mg  of elemental calcium by mouth daily with breakfast.    . escitalopram (LEXAPRO) 10 MG tablet Take 5 mg by mouth daily.     Marland Kitchen loratadine (CLARITIN) 10 MG tablet Take 10 mg by mouth daily.    . montelukast (SINGULAIR) 10 MG tablet Take one tablet once daily 90 tablet 1  . Multiple Vitamins-Minerals (MULTIVITAMIN WITH MINERALS) tablet Take 1 tablet by mouth daily.    . potassium chloride SA (K-DUR,KLOR-CON) 20 MEQ tablet Take 20 mEq by mouth 2 (two) times daily.    Marland Kitchen triamterene-hydrochlorothiazide (MAXZIDE-25) 37.5-25 MG per tablet Take 1 each (1 tablet total) by mouth daily.     No  current facility-administered medications for this visit.    Allergies:   Caffeine, Codeine, Floxin [ofloxacin], Latex, and Prednisone   Social History:  The patient  reports that she has never smoked. She has never used smokeless tobacco. She reports that she does not drink alcohol and does not use drugs.   Family History:  The patient's family history includes Cancer in her maternal grandmother; Stroke in her father.   ROS:  Please see the history of present illness.   Otherwise, review of systems is positive for none.   All other systems are reviewed and negative.   PHYSICAL EXAM: VS:  BP 112/74   Pulse 86   Ht 5\' 4"  (1.626 m)   Wt 215 lb (97.5 kg)   SpO2 97%   BMI 36.90 kg/m  , BMI Body mass index is 36.9 kg/m. GEN: Well nourished, well developed, in no acute distress  HEENT: normal  Neck: no JVD, carotid bruits, or masses Cardiac: RRR; no murmurs, rubs, or gallops,no edema  Respiratory:  clear to auscultation bilaterally, normal work of breathing GI: soft, nontender, nondistended, + BS MS: no deformity or atrophy  Skin: warm and dry Neuro:  Strength and sensation are intact Psych: euthymic mood, full affect  EKG:  EKG is ordered today. Personal review of the ekg ordered shows sinus rhythm, rate 86  Recent Labs: No results found for requested labs within last 8760 hours.    Lipid Panel  No results found for: CHOL, TRIG, HDL, CHOLHDL, VLDL, LDLCALC, LDLDIRECT   Wt Readings from Last 3 Encounters:  11/01/20 215 lb (97.5 kg)  01/23/18 223 lb 3.2 oz (101.2 kg)  06/01/17 206 lb 12.8 oz (93.8 kg)   Holter Average HR: 84 Minimum HR: 59 Maximum HR: 126  Ventricular ectopy 3977 (1.7%) Supraventricular ectopy 1094 without runs Zero atrial fibrillation Primary rhythm sinus rhythm  Palpitations associated with sinus rhythm Flutters associated with sinus rhythm Driving/ felt weak associated with sinus rhythm, rate 106 Chest pain associated with sinus  rhythm  ASSESSMENT AND PLAN:  1.  Palpitations: Status post ablation attempt 02/29/2016, but unfortunately she was noninducible without arrhythmia noted.  She recently wore a cardiac monitor that was placed by her primary physician.  Llana Deshazo work to get the results and strips from that monitor.  2.  Chest pressure: Pressure occurs when she exerts herself and at times improves with rest.  This is also at times associated with shortness of breath.  Due to that, we Bradden Tadros plan for a treadmill Myoview.  Current medicines are reviewed at length with the patient today.   The patient has concerns regarding her medicines.  The following changes were made today: None  Labs/ tests ordered today include:  Orders Placed This Encounter  Procedures  . Myocardial Perfusion Imaging  . EKG 12-Lead  Disposition:   FU with Dior Dominik pending Myoview months  Signed, Jahron Hunsinger Jorja Loa, MD  11/01/2020 11:31 AM     [facility] [address] [place] [address] [phone] (office) [phone] (fax)

## 2020-11-02 NOTE — Addendum Note (Signed)
Addended by: Baird Lyons on: 11/02/2020 09:39 AM   Modules accepted: Orders

## 2020-11-03 ENCOUNTER — Telehealth (HOSPITAL_COMMUNITY): Payer: Self-pay

## 2020-11-03 NOTE — Telephone Encounter (Signed)
Detailed instructions left on the patient's answering machine. Asked to call back with any questions. S.Cayleigh Paull EMTP 

## 2020-11-04 ENCOUNTER — Other Ambulatory Visit (HOSPITAL_COMMUNITY): Payer: BC Managed Care – PPO

## 2020-11-09 ENCOUNTER — Other Ambulatory Visit: Payer: Self-pay

## 2020-11-09 ENCOUNTER — Ambulatory Visit (HOSPITAL_COMMUNITY): Payer: BC Managed Care – PPO | Attending: Cardiology

## 2020-11-09 VITALS — Ht 64.0 in | Wt 215.0 lb

## 2020-11-09 DIAGNOSIS — I471 Supraventricular tachycardia: Secondary | ICD-10-CM | POA: Insufficient documentation

## 2020-11-09 DIAGNOSIS — R0789 Other chest pain: Secondary | ICD-10-CM | POA: Diagnosis present

## 2020-11-09 DIAGNOSIS — R110 Nausea: Secondary | ICD-10-CM | POA: Diagnosis present

## 2020-11-09 DIAGNOSIS — R002 Palpitations: Secondary | ICD-10-CM | POA: Insufficient documentation

## 2020-11-09 LAB — MYOCARDIAL PERFUSION IMAGING
LV dias vol: 59 mL (ref 46–106)
LV sys vol: 16 mL
Peak HR: 97 {beats}/min
Rest HR: 77 {beats}/min
SDS: 1
SRS: 1
SSS: 2
TID: 1.11

## 2020-11-09 MED ORDER — TECHNETIUM TC 99M TETROFOSMIN IV KIT
10.0000 | PACK | Freq: Once | INTRAVENOUS | Status: AC | PRN
  Administered 2020-11-09: 10 via INTRAVENOUS
  Filled 2020-11-09: qty 10

## 2020-11-09 MED ORDER — REGADENOSON 0.4 MG/5ML IV SOLN
0.4000 mg | Freq: Once | INTRAVENOUS | Status: AC
  Administered 2020-11-09: 0.4 mg via INTRAVENOUS

## 2020-11-09 MED ORDER — AMINOPHYLLINE 25 MG/ML IV SOLN
75.0000 mg | Freq: Once | INTRAVENOUS | Status: AC
  Administered 2020-11-09: 75 mg via INTRAVENOUS

## 2020-11-09 MED ORDER — TECHNETIUM TC 99M TETROFOSMIN IV KIT
32.4000 | PACK | Freq: Once | INTRAVENOUS | Status: AC | PRN
  Administered 2020-11-09: 32.4 via INTRAVENOUS
  Filled 2020-11-09: qty 33

## 2020-11-15 ENCOUNTER — Telehealth: Payer: Self-pay | Admitting: Cardiology

## 2020-11-15 NOTE — Telephone Encounter (Signed)
Patient is calling to get stress test results 

## 2020-11-15 NOTE — Telephone Encounter (Signed)
Informed patient of results and verbal understanding expressed. Aware I will let her know recommendation for follow up once he advises.

## 2020-11-16 NOTE — Telephone Encounter (Signed)
Pt made aware that Dr. Elberta Fortis would like to review medical records that we requested (cardiac monitor from PCP). Aware will follow up once reviewed/advised. Pt agreeable

## 2020-12-24 ENCOUNTER — Other Ambulatory Visit: Payer: Self-pay | Admitting: Allergy and Immunology

## 2020-12-24 DIAGNOSIS — K219 Gastro-esophageal reflux disease without esophagitis: Secondary | ICD-10-CM

## 2021-02-04 ENCOUNTER — Other Ambulatory Visit: Payer: Self-pay | Admitting: Allergy and Immunology

## 2021-03-24 ENCOUNTER — Telehealth: Payer: Self-pay | Admitting: Internal Medicine

## 2021-03-24 NOTE — Telephone Encounter (Signed)
Hey Dr. Rhea Belton,   We received a referral from patient's PCP, Dr. Leonor Liv for a repeat colonoscopy. Records available via Epic from 2014. Please advise on scheduling.   Thanks

## 2021-03-28 NOTE — Telephone Encounter (Signed)
Called patient to advise and schedule left voicemail. 

## 2021-03-28 NOTE — Telephone Encounter (Signed)
She had a colonoscopy in Feb 2014 for bleeding.  Hemorrhoids were found. Excellent prep to the cecum with dr. Madilyn Fireman If no family hx of advanced colon polyps or colon cancer then screening is recommended in Feb 2024; if family history is pertinent then please let me know. Thanks [employer]

## 2021-04-04 NOTE — Telephone Encounter (Signed)
Patient was advise of recommendations and was in agreement recall in Epic for 01/2023.

## 2021-04-04 NOTE — Telephone Encounter (Signed)
Called patient to schedule left voicemail. 

## 2021-04-30 ENCOUNTER — Other Ambulatory Visit: Payer: Self-pay | Admitting: Allergy and Immunology

## 2021-05-11 ENCOUNTER — Other Ambulatory Visit: Payer: Self-pay

## 2021-05-11 ENCOUNTER — Telehealth: Payer: Self-pay | Admitting: Allergy and Immunology

## 2021-05-11 DIAGNOSIS — K219 Gastro-esophageal reflux disease without esophagitis: Secondary | ICD-10-CM

## 2021-05-11 MED ORDER — MONTELUKAST SODIUM 10 MG PO TABS: ORAL_TABLET | ORAL | 0 refills | Status: DC

## 2021-05-11 MED ORDER — PANTOPRAZOLE SODIUM 40 MG PO TBEC: DELAYED_RELEASE_TABLET | ORAL | 0 refills | Status: DC

## 2021-05-11 NOTE — Telephone Encounter (Signed)
Patient would like to know if courtesy refills can be called in to [facility] [address] in [place] for montelukast and pantoprazole. Patient is completely out of pantoprazole. Patient made an appointment for 5/25 at 4pm, because she needs a later afternoon.  Please advise.

## 2021-05-11 NOTE — Telephone Encounter (Signed)
Courtesy refills for Pantoprazole and [facility]uk[facility] [place]. Patient has been notified

## 2021-05-18 ENCOUNTER — Encounter: Payer: Self-pay | Admitting: Allergy and Immunology

## 2021-05-18 ENCOUNTER — Other Ambulatory Visit: Payer: Self-pay

## 2021-05-18 ENCOUNTER — Ambulatory Visit: Payer: BC Managed Care – PPO | Admitting: Allergy and Immunology

## 2021-05-18 VITALS — BP 118/72 | HR 80 | Resp 16 | Ht 64.0 in | Wt 225.0 lb

## 2021-05-18 DIAGNOSIS — K219 Gastro-esophageal reflux disease without esophagitis: Secondary | ICD-10-CM

## 2021-05-18 DIAGNOSIS — J4540 Moderate persistent asthma, uncomplicated: Secondary | ICD-10-CM | POA: Diagnosis not present

## 2021-05-18 DIAGNOSIS — J3089 Other allergic rhinitis: Secondary | ICD-10-CM

## 2021-05-18 MED ORDER — ALBUTEROL SULFATE HFA 108 (90 BASE) MCG/ACT IN AERS: INHALATION_SPRAY | RESPIRATORY_TRACT | 1 refills | Status: DC

## 2021-05-18 MED ORDER — PANTOPRAZOLE SODIUM 40 MG PO TBEC: DELAYED_RELEASE_TABLET | ORAL | 5 refills | Status: DC

## 2021-05-18 MED ORDER — BUDESONIDE-FORMOTEROL FUMARATE 80-4.5 MCG/ACT IN AERO: INHALATION_SPRAY | RESPIRATORY_TRACT | 5 refills | Status: DC

## 2021-05-18 MED ORDER — FLUTICASONE PROPIONATE 50 MCG/ACT NA SUSP: NASAL | 5 refills | Status: DC

## 2021-05-18 MED ORDER — MUPIROCIN 2 % EX OINT: TOPICAL_OINTMENT | CUTANEOUS | 0 refills | Status: DC

## 2021-05-18 MED ORDER — MONTELUKAST SODIUM 10 MG PO TABS: ORAL_TABLET | ORAL | 5 refills | Status: DC

## 2021-05-18 NOTE — Progress Notes (Signed)
[place] [facility] [place] [facility] [place] [facility] [place] [facility] [place]   Follow-up Note  Referring Provider: Marylen Ponto, MD Primary Provider: Marylen Ponto, MD Date of Office Visit: 05/18/2021  Subjective:   Michelle Miles (DOB: [birth year]) is a 50 y.o. female who [facility] in re-evaluation of the following:  HPI: Michelle Miles returns to the clinic in evaluation of asthma and allergic rhinitis and LPR and history of migraine.  Her last visit to this clinic was 11 August 2020.  She states that she was doing quite well without any significant issues involving her lungs.  She did not require a systemic steroid to treat an exacerbation of asthma while she used Symbicort 1 time per day and she rarely uses a short acting bronchodilator and can exercise without any problem.  She did contract COVID in January 2022 with what sounds like a combination of upper and lower airway symptoms for which she was treated with Plaxovid.  She does not have any long-term sequela from that infection.  She was doing very well with her nose and did not require an antibiotic to treat an episode of sinusitis while she remained on montelukast on a consistent basis and rarely used any nasal steroid.  She believe that her reflux was under very good control as long she uses a proton pump inhibitor twice a day.  She was not having any migraines ever since she dramatically consolidated her caffeine consumption to 1 coffee a day.  However, about 3 weeks ago, she developed an episode of vomiting and diarrhea and then 2 weeks ago developed acute onset of runny nose and nose blowing and itchy watery eyes after being exposed to vacuum dust.  Then she progressed into facial pain and coughing and just feeling terrible for which she went to see her primary care doctor a week ago and was treated with Augmentin and Xyzal and she is slowly improving but she still continues to cough significantly.   She does not have any shortness of breath or chest tightness or chest pain or sputum production and she never had any fever during this episode.  Allergies as of 05/18/2021      Reactions   Caffeine Other (See Comments)   Makes pt light headed   Codeine Nausea And Vomiting   Floxin [ofloxacin] Other (See Comments)   Causes hallucinations   Latex Rash   Prednisone Rash   Oral only      Medication List      budesonide-formoterol 80-4.5 MCG/ACT inhaler Commonly known as: Symbicort Inhale two puffs twice daily to prevent cough or wheeze.  Rinse, gargle, and spit after use.   calcium carbonate 1500 (600 Ca) MG Tabs tablet Commonly known as: OSCAL Take 600 mg of elemental calcium by mouth daily with breakfast.   escitalopram 10 MG tablet Commonly known as: LEXAPRO Take 5 mg by mouth daily.   loratadine 10 MG tablet Commonly known as: CLARITIN Take 10 mg by mouth daily.   montelukast 10 MG tablet Commonly known as: SINGULAIR TAKE 1 TABLET BY MOUTH EVERY DAY   multivitamin with minerals tablet Take 1 tablet by mouth daily.   pantoprazole 40 MG tablet Commonly known as: PROTONIX Take 1 tablet 1-2 times per day   potassium chloride SA 20 MEQ tablet Commonly known as: KLOR-CON Take 20 mEq by mouth 2 (two) times daily.   triamterene-hydrochlorothiazide 37.5-25 MG tablet Commonly known as: MAXZIDE-25 Take 1 each (1 tablet  total) by mouth daily.       Past Medical History:  Diagnosis Date  . Asthma   . Chronic bronchitis (HCC)   . Chronic sinus infection    "used to get them all the time; recently had one after none in 3-4 years" (02/29/2016)  . Family history of adverse reaction to anesthesia    "daughter gets PONV & is hard to wake up"  . GERD (gastroesophageal reflux disease)   . Hypokalemia   . IBS (irritable bowel syndrome)   . Meniere's disease    trated with maxide  . PONV (postoperative nausea and vomiting)    also "slow to wake"  . Recurrent upper  respiratory infection (URI)   . SVT (supraventricular tachycardia) (HCC)     Past Surgical History:  Procedure Laterality Date  . CARPAL TUNNEL RELEASE Right ~ 2008  . CARPAL TUNNEL RELEASE Left 02/2020  . CHOLECYSTECTOMY    . COLONOSCOPY N/A 02/03/2013   Procedure: COLONOSCOPY;  Surgeon: Barrie Folk, MD;  Location: [facility] ENDOSCOPY;  Service: Endoscopy;  Laterality: N/A;  . DILATION AND CURETTAGE OF UTERUS  1998   "1 wk after I had my son"  . ELECTROPHYSIOLOGIC STUDY N/A 02/29/2016   Procedure: SVT Ablation;  Surgeon: Will Jorja Loa, MD;  Location: [facility] INVASIVE CV LAB;  Service: Cardiovascular;  Laterality: N/A;  . ENDOMETRIAL ABLATION    . HEMORRHOID SURGERY N/A 02/19/2013   Procedure: PROCEDURE PROLAPSED HEMORRHOIDS;  Surgeon: Mariella Saa, MD;  Location: [facility] ORS;  Service: General;  Laterality: N/A;  . LAPAROSCOPIC CHOLECYSTECTOMY  ~ 2007  . NASAL SEPTUM SURGERY  ~ 2010  . PLANTAR FASCIA RELEASE Right 2000s   right  . SINOSCOPY    . SUPRAVENTRICULAR TACHYCARDIA ABLATION  02/29/2016  . TENDON REPAIR  02/2020    Review of systems negative except as noted in HPI / PMHx or noted below:  Review of Systems  Constitutional: Negative.   HENT: Negative.   Eyes: Negative.   Respiratory: Negative.   Cardiovascular: Negative.   Gastrointestinal: Negative.   Genitourinary: Negative.   Musculoskeletal: Negative.   Skin: Negative.   Neurological: Negative.   Endo/Heme/Allergies: Negative.   Psychiatric/Behavioral: Negative.      Objective:   Vitals:   05/18/21 1609  BP: 118/72  Pulse: 80  Resp: 16  SpO2: 96%   Height: 5\' 4"  (162.6 cm)  Weight: 225 lb (102.1 kg)   Physical Exam Constitutional:      Appearance: She is not diaphoretic.     Comments: Nasal voice  HENT:     Head: Normocephalic.     Right Ear: Tympanic membrane, ear canal and external ear normal.     Left Ear: Tympanic membrane, ear canal and external ear normal.     Nose: Mucosal edema (Several  patches of erythema and excoriation and blood) present. No rhinorrhea.     Mouth/Throat:     Pharynx: Uvula midline. No oropharyngeal exudate.  Eyes:     Conjunctiva/sclera: Conjunctivae normal.  Neck:     Thyroid: No thyromegaly.     Trachea: Trachea normal. No tracheal tenderness or tracheal deviation.  Cardiovascular:     Rate and Rhythm: Normal rate and regular rhythm.     Heart sounds: Normal heart sounds, S1 normal and S2 normal. No murmur heard.   Pulmonary:     Effort: No respiratory distress.     Breath sounds: Normal breath sounds. No stridor. No wheezing or rales.  Lymphadenopathy:  Head:     Right side of head: No tonsillar adenopathy.     Left side of head: No tonsillar adenopathy.     Cervical: No cervical adenopathy.  Skin:    Findings: No erythema or rash.     Nails: There is no clubbing.  Neurological:     Mental Status: She is alert.     Diagnostics:    Spirometry was performed and demonstrated an FEV1 of 2.28 at 83 % of predicted.  Assessment and Plan:   1. Asthma, moderate persistent, well-controlled   2. Other allergic rhinitis   3. LPRD (laryngopharyngeal reflux disease)     1.  Continue to Treat and prevent inflammation:   A.  Symbicort 80 - 2 inhalations 1-2 times a day    B.  Montelukast 10 mg - 1 tablet 1 time per day  C.  Flonase - 1-2 sprays each nostril 1 time per day  2.  Continue to Treat and prevent reflux:   A.  Remain off all forms of caffeine and chocolate  B.  Protonix 40 mg 1-2 times per day  3. If needed:   A.  Claritin or Zyrtec 10 mg 1 tablet once a day  B.  Pro Air HFA 2 puffs every 4-6 hours  C.  Nasal saline spray  4. For this episode:   A. Consistently use Symbicort 2 times per day  B. Can add Mucinex DM - 1-2 tablets 1-2 times per day  C. No nasal steroid  D. Nasal saline then bactroban ointment in nostrils 3 times a day x 10 days  5.  Return to clinic in 6 months or earlier if problem    Michelle Miles has  been doing very well until she contracted what is most likely a viral respiratory tract infection that is given rise to significant inflammation of her upper airway and has really torn up her nasal mucosa and is giving rise to a persistent cough.  Augmentin should be broad-spectrum enough to cover most of the bacterial organisms that may be involved with this issue and I am not going to give her any additional antibiotics and I am not going to give her any steroids at this point but we will have her utilize the plan noted above which includes consistent use of her Symbicort and the addition of Mucinex DM and some Bactroban ointment in her nostrils for the next 10 days.  Assuming she does well with this plan I will see her back in this clinic in 6 months or earlier if there is a problem.  Laurette Schimke, MD Allergy / Immunology [facility]

## 2021-05-18 NOTE — Patient Instructions (Addendum)
  1.  Continue to Treat and prevent inflammation:   A.  Symbicort 80 - 2 inhalations 1-2 times a day    B.  Montelukast 10 mg - 1 tablet 1 time per day  C.  Flonase - 1-2 sprays each nostril 1 time per day  2.  Continue to Treat and prevent reflux:   A.  Remain off all forms of caffeine and chocolate  B.  Protonix 40 mg 1-2 times per day  3. If needed:   A.  Claritin or Zyrtec 10 mg 1 tablet once a day  B.  Pro Air HFA 2 puffs every 4-6 hours  C.  Nasal saline spray  4. For this episode:   A. Consistently use Symbicort 2 times per day  B. Can add Mucinex DM - 1-2 tablets 1-2 times per day  C. No nasal steroid  D. Nasal saline then bactroban ointment in nostrils 3 times a day x 10 days  5.  Return to clinic in 6 months or earlier if problem

## 2021-05-19 ENCOUNTER — Encounter: Payer: Self-pay | Admitting: Allergy and Immunology

## 2021-11-18 ENCOUNTER — Other Ambulatory Visit: Payer: Self-pay | Admitting: Allergy and Immunology

## 2022-04-15 ENCOUNTER — Other Ambulatory Visit: Payer: Self-pay | Admitting: Allergy and Immunology

## 2022-04-15 DIAGNOSIS — K219 Gastro-esophageal reflux disease without esophagitis: Secondary | ICD-10-CM

## 2022-11-01 ENCOUNTER — Other Ambulatory Visit: Payer: Self-pay | Admitting: Allergy and Immunology

## 2022-11-01 DIAGNOSIS — K219 Gastro-esophageal reflux disease without esophagitis: Secondary | ICD-10-CM

## 2022-12-19 ENCOUNTER — Other Ambulatory Visit: Payer: Self-pay | Admitting: Allergy and Immunology

## 2022-12-21 ENCOUNTER — Other Ambulatory Visit: Payer: Self-pay | Admitting: *Deleted

## 2022-12-21 MED ORDER — MONTELUKAST SODIUM 10 MG PO TABS: 10.0000 mg | ORAL_TABLET | Freq: Every day | ORAL | 0 refills | Status: DC

## 2023-01-10 ENCOUNTER — Ambulatory Visit: Payer: BC Managed Care – PPO | Admitting: Allergy and Immunology

## 2023-01-10 ENCOUNTER — Encounter: Payer: Self-pay | Admitting: Allergy and Immunology

## 2023-01-10 VITALS — BP 112/62 | HR 86 | Resp 16 | Ht 64.0 in | Wt 236.4 lb

## 2023-01-10 DIAGNOSIS — J4540 Moderate persistent asthma, uncomplicated: Secondary | ICD-10-CM | POA: Diagnosis not present

## 2023-01-10 DIAGNOSIS — K219 Gastro-esophageal reflux disease without esophagitis: Secondary | ICD-10-CM | POA: Diagnosis not present

## 2023-01-10 DIAGNOSIS — J3089 Other allergic rhinitis: Secondary | ICD-10-CM | POA: Diagnosis not present

## 2023-01-10 MED ORDER — BUDESONIDE-FORMOTEROL FUMARATE 80-4.5 MCG/ACT IN AERO: INHALATION_SPRAY | RESPIRATORY_TRACT | 5 refills | Status: AC

## 2023-01-10 MED ORDER — ALBUTEROL SULFATE HFA 108 (90 BASE) MCG/ACT IN AERS: INHALATION_SPRAY | RESPIRATORY_TRACT | 1 refills | Status: DC

## 2023-01-10 MED ORDER — MONTELUKAST SODIUM 10 MG PO TABS: 10.0000 mg | ORAL_TABLET | Freq: Every day | ORAL | 3 refills | Status: DC

## 2023-01-10 MED ORDER — PANTOPRAZOLE SODIUM 40 MG PO TBEC: DELAYED_RELEASE_TABLET | ORAL | 3 refills | Status: DC

## 2023-01-10 MED ORDER — FLUTICASONE PROPIONATE 50 MCG/ACT NA SUSP: NASAL | 5 refills | Status: AC

## 2023-01-10 NOTE — Progress Notes (Signed)
[place]   Follow-up Note  Referring Provider: Ronita Hipps, MD Primary Provider: Ronita Hipps, MD Date of Office Visit: 01/10/2023  Subjective:   Michelle Miles (DOB: 1971-01-02) is a 52 y.o. female who [facility] to [facility] on 01/10/2023 in re-evaluation of the following:  HPI: Michelle Miles returns to this clinic in reevaluation of asthma, allergic rhinitis, LPR.  I last saw her in this clinic 18 June 2021.  Other than developing a episode of "sinusitis" in December 2023 requiring administration of 2 antibiotics complicated by epistaxis when using her nasal steroid she has really done well with her airway and has not required any additional systemic steroid or antibiotic since I last seen her in this clinic while she consistently uses her Symbicort 1 time per day.  Rarely does she use a short acting bronchodilator.  As well, her reflux is under excellent control with using a proton pump inhibitor 1 time per day.  She has obtained this years flu vaccine.  In March 2023 she underwent successful right hip replacement.  Allergies as of 01/10/2023       Reactions   Caffeine Other (See Comments)   Makes pt light headed   Codeine Nausea And Vomiting   Floxin [ofloxacin] Other (See Comments)   Causes hallucinations   Latex Rash   Prednisone Rash   Oral only        Medication List    albuterol 108 (90 Base) MCG/ACT inhaler Commonly known as: VENTOLIN HFA 2 puffs every 4-6 hours as needed   budesonide-formoterol 80-4.5 MCG/ACT inhaler Commonly known as: Symbicort Inhale two puffs twice daily to prevent cough or wheeze.  Rinse, gargle, and spit after use.   calcium carbonate 1500 (600 Ca) MG Tabs tablet Commonly known as: OSCAL Take 600 mg of elemental calcium by mouth daily with breakfast.   escitalopram 10 MG tablet Commonly known as: LEXAPRO Take 5 mg by mouth daily.   fluticasone 50 MCG/ACT  nasal spray Commonly known as: FLONASE 1-2 sprays in each nostril 1 time per day   levocetirizine 5 MG tablet Commonly known as: XYZAL Take 2.5-5 mg by mouth daily.   montelukast 10 MG tablet Commonly known as: SINGULAIR Take 1 tablet (10 mg total) by mouth daily.   multivitamin with minerals tablet Take 1 tablet by mouth daily.   mupirocin ointment 2 % Commonly known as: BACTROBAN Apply in each nostril 3 times daily for 10 days   pantoprazole 40 MG tablet Commonly known as: PROTONIX TAKE 1 TABLET 1-2 TIMES PER DAY   potassium chloride SA 20 MEQ tablet Commonly known as: KLOR-CON M Take 20 mEq by mouth 2 (two) times daily.   triamterene-hydrochlorothiazide 37.5-25 MG tablet Commonly known as: MAXZIDE-25 Take 1 each (1 tablet total) by mouth daily.    Past Medical History:  Diagnosis Date   Asthma    Chronic bronchitis (HCC)    Chronic sinus infection    "used to get them all the time; recently had one after none in 3-4 years" (02/29/2016)   Family history of adverse reaction to anesthesia    "daughter gets PONV & is hard to wake up"   GERD (gastroesophageal reflux disease)    Hypokalemia    IBS (irritable bowel syndrome)    Meniere's disease    trated with maxide   PONV (postoperative nausea and vomiting)    also "slow to wake"   Recurrent upper  respiratory infection (URI)    SVT (supraventricular tachycardia)     Past Surgical History:  Procedure Laterality Date   CARPAL TUNNEL RELEASE Right ~ 2008   CARPAL TUNNEL RELEASE Left 02/2020   CHOLECYSTECTOMY     COLONOSCOPY N/A 02/03/2013   Procedure: COLONOSCOPY;  Surgeon: Barrie Folk, MD;  Location: [facility] ENDOSCOPY;  Service: Endoscopy;  Laterality: N/A;   DILATION AND CURETTAGE OF UTERUS  1998   "1 wk after I had my son"   ELECTROPHYSIOLOGIC STUDY N/A 02/29/2016   Procedure: SVT Ablation;  Surgeon: Will Jorja Loa, MD;  Location: [facility] INVASIVE CV LAB;  Service: Cardiovascular;  Laterality: N/A;   ENDOMETRIAL  ABLATION     HEMORRHOID SURGERY N/A 02/19/2013   Procedure: PROCEDURE PROLAPSED HEMORRHOIDS;  Surgeon: Mariella Saa, MD;  Location: [facility] ORS;  Service: General;  Laterality: N/A;   LAPAROSCOPIC CHOLECYSTECTOMY  ~ 2007   NASAL SEPTUM SURGERY  ~ 2010   PLANTAR FASCIA RELEASE Right 2000s   right   SINOSCOPY     SUPRAVENTRICULAR TACHYCARDIA ABLATION  02/29/2016   TENDON REPAIR  02/2020    Review of systems negative except as noted in HPI / PMHx or noted below:  Review of Systems  Constitutional: Negative.   HENT: Negative.    Eyes: Negative.   Respiratory: Negative.    Cardiovascular: Negative.   Gastrointestinal: Negative.   Genitourinary: Negative.   Musculoskeletal: Negative.   Skin: Negative.   Neurological: Negative.   Endo/Heme/Allergies: Negative.   Psychiatric/Behavioral: Negative.       Objective:   Vitals:   01/10/23 0842  BP: 112/62  Pulse: 86  Resp: 16  SpO2: 97%   Height: 5\' 4"  (162.6 cm)  Weight: 236 lb 6.4 oz (107.2 kg)   Physical Exam Constitutional:      Appearance: She is not diaphoretic.  HENT:     Head: Normocephalic.     Right Ear: Tympanic membrane, ear canal and external ear normal.     Left Ear: Tympanic membrane, ear canal and external ear normal.     Nose: Nose normal. No mucosal edema or rhinorrhea.     Mouth/Throat:     Pharynx: Uvula midline. No oropharyngeal exudate.  Eyes:     Conjunctiva/sclera: Conjunctivae normal.  Neck:     Thyroid: No thyromegaly.     Trachea: Trachea normal. No tracheal tenderness or tracheal deviation.  Cardiovascular:     Rate and Rhythm: Normal rate and regular rhythm.     Heart sounds: Normal heart sounds, S1 normal and S2 normal. No murmur heard. Pulmonary:     Effort: No respiratory distress.     Breath sounds: Normal breath sounds. No stridor. No wheezing or rales.  Lymphadenopathy:     Head:     Right side of head: No tonsillar adenopathy.     Left side of head: No tonsillar adenopathy.      Cervical: No cervical adenopathy.  Skin:    Findings: No erythema or rash.     Nails: There is no clubbing.  Neurological:     Mental Status: She is alert.     Diagnostics:    Spirometry was performed and demonstrated an FEV1 of 2.28 at 84 % of predicted.  Assessment and Plan:   1. Asthma, moderate persistent, well-controlled   2. Other allergic rhinitis   3. LPRD (laryngopharyngeal reflux disease)     1.  Continue to Treat and prevent inflammation:   A.  Symbicort 80 - 2 inhalations  1-2 times a day    B.  Montelukast 10 mg - 1 tablet 1 time per day  C.  Flonase - 1-2 sprays each nostril 1-7 times per week  2.  Continue to Treat and prevent reflux:   A.  Protonix 40 mg 1-2 times per day  3. If needed:   A.  Claritin or Zyrtec 10 mg 1 tablet once a day  B.  Pro Air HFA 2 puffs every 4-6 hours  C.  Nasal saline spray  4. Return to clinic in 12 months or earlier if problem    Connor appears to be doing quite well at this point in time regarding her respiratory tract issue which is a combination of atopic inflammation and reflux induced respiratory disease on her current plan.  She has a good understanding of her disease state and appropriate use of her medications with dosing depending on disease activity.  Will see her back in this clinic in 1 year or earlier if there is a problem.  Allena Katz, MD Allergy / Immunology [place]

## 2023-01-10 NOTE — Patient Instructions (Signed)
  1.  Continue to Treat and prevent inflammation:   A.  Symbicort 80 - 2 inhalations 1-2 times a day    B.  Montelukast 10 mg - 1 tablet 1 time per day  C.  Flonase - 1-2 sprays each nostril 1-7 times per week  2.  Continue to Treat and prevent reflux:   A.  Protonix 40 mg 1-2 times per day  3. If needed:   A.  Claritin or Zyrtec 10 mg 1 tablet once a day  B.  Pro Air HFA 2 puffs every 4-6 hours  C.  Nasal saline spray  4. Return to clinic in 12 months or earlier if problem

## 2023-01-11 ENCOUNTER — Encounter: Payer: Self-pay | Admitting: Allergy and Immunology

## 2023-04-04 ENCOUNTER — Encounter: Payer: Self-pay | Admitting: Internal Medicine

## 2023-08-13 ENCOUNTER — Ambulatory Visit (HOSPITAL_COMMUNITY)
Admission: RE | Admit: 2023-08-13 | Discharge: 2023-08-13 | Disposition: A | Discharge status: Home / Self Care | Payer: BC Managed Care – PPO | Source: Ambulatory Visit | Attending: Cardiology | Admitting: Cardiology

## 2023-08-13 ENCOUNTER — Other Ambulatory Visit (HOSPITAL_COMMUNITY): Payer: Self-pay | Admitting: Medical

## 2023-08-13 DIAGNOSIS — M79605 Pain in left leg: Secondary | ICD-10-CM

## 2024-01-20 ENCOUNTER — Other Ambulatory Visit: Payer: Self-pay | Admitting: Allergy and Immunology

## 2024-01-30 ENCOUNTER — Ambulatory Visit: Payer: 59 | Admitting: Allergy and Immunology

## 2024-01-30 ENCOUNTER — Encounter: Payer: Self-pay | Admitting: Allergy and Immunology

## 2024-01-30 VITALS — BP 122/80 | HR 88 | Resp 14 | Ht 63.3 in | Wt 236.6 lb

## 2024-01-30 DIAGNOSIS — J4540 Moderate persistent asthma, uncomplicated: Secondary | ICD-10-CM | POA: Diagnosis not present

## 2024-01-30 DIAGNOSIS — J340 Abscess, furuncle and carbuncle of nose: Secondary | ICD-10-CM

## 2024-01-30 DIAGNOSIS — J3089 Other allergic rhinitis: Secondary | ICD-10-CM | POA: Diagnosis not present

## 2024-01-30 DIAGNOSIS — K219 Gastro-esophageal reflux disease without esophagitis: Secondary | ICD-10-CM

## 2024-01-30 MED ORDER — MONTELUKAST SODIUM 10 MG PO TABS: 10.0000 mg | ORAL_TABLET | Freq: Every day | ORAL | 1 refills | Status: DC

## 2024-01-30 MED ORDER — AIRSUPRA 90-80 MCG/ACT IN AERO: 2.0000 | INHALATION_SPRAY | RESPIRATORY_TRACT | 1 refills | Status: AC | PRN

## 2024-01-30 MED ORDER — MUPIROCIN 2 % EX OINT: TOPICAL_OINTMENT | CUTANEOUS | 0 refills | Status: AC

## 2024-01-30 NOTE — Patient Instructions (Addendum)
  1.  No more antibiotics or steroids  2. Continue to Treat and prevent inflammation:   A.  Symbicort  80 - 2 inhalations 1-2 times a day    B.  Montelukast  10 mg - 1 tablet 1 time per day  C.  Flonase  - 1-2 sprays each nostril 1-7 times per week  3.  Continue to Treat and prevent reflux:   A.  Protonix  40 mg 1-2 times per day  4. If needed:   A.  Levocetirizine 5 mg -1 tablet once a day  B.  AIRSUPRA  -  2 puffs every 4-6 hours (coupon)  C.  Nasal saline spray  D.  Pataday - 1 drop each eye 1 time per day  5. For this recent event:   A. Nasal saline followed by Bactroban  - 3 times per day for 10 days  B. Increase Levocetirizine - 1-2 tablets 1-2 times per day (MAX=4/day)  C. Blood - area 2 aeroallergen profile, IgA/G/M, cbc w/d  6. Return to clinic in 4 weeks or earlier if problem

## 2024-01-30 NOTE — Progress Notes (Signed)
 [place]   Follow-up Note  Referring Provider: Ina Marcellus RAMAN, MD Primary Provider: Ina Marcellus RAMAN, MD Date of Office Visit: 01/30/2024  Subjective:   Michelle Miles (DOB: 1971/06/23) is a 53 y.o. female who [facility] in re-evaluation of the following:  HPI: Michelle Miles returns to this clinic in reevaluation of asthma, allergic rhinitis, LPR.  I last saw her in this clinic 10 January 2023.  For most of the year she did very well with her asthma and allergic rhinitis while consistently using some anti-inflammatory agents for her airway including use of Symbicort  approximately twice a week and some Flonase  on a daily basis and montelukast  on a daily basis.  She did not require systemic steroid or an antibiotic for any type of airway issue during the winter and spring and summer 2024.  Unfortunately, during this fall and winter she has had a very difficult time.  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  She states that she has had 4 sinus infections.  She has been treated with at least 2 injections of systemic steroid and she has had at least 4 antibiotics.  Her sinus infections are manifested as face pain and nasal congestion and some yellow-green nasal discharge.  Most recently she has had lots of nasal pain and she has some itchy red watery eyes.  She may have actually have developed a perforation of her left ear at 1 point as she heard some whooshing noise in that ear and apparently on exam there was detection of a perforation.  She can smell and she can taste.  She is not really having any bad headaches.  Her asthma is not really been causing her any problems with these events.  Her reflux has been under good control.  She believes that one of the triggers for her persistent respiratory tract issues during the  school season is exposure to children who have allergens located on their body.  Specifically she is referring to cat allergen.  Allergies as of 01/30/2024       Reactions   Caffeine Other (See Comments)   Makes pt light headed   Codeine Nausea And Vomiting   Floxin [ofloxacin] Other (See Comments)   Causes hallucinations   Latex Rash   Prednisone Rash   Oral only        Medication List    Airsupra  90-80 MCG/ACT Aero Generic drug: Albuterol -Budesonide  Inhale 2 puffs into the lungs as needed (every 4 to 6 hours for cough, wheeze, shortness of breath.  Rinse, gargle, and spit after use). Started by: Michelle Miles J Michelle Miles   albuterol  108 (90 Base) MCG/ACT inhaler Commonly known as: VENTOLIN  HFA 2 puffs every 4-6 hours as needed   budesonide -formoterol  80-4.5 MCG/ACT inhaler Commonly known as: Symbicort  Inhale two puffs twice daily to prevent cough or wheeze.  Rinse, gargle, and spit after use.   calcium carbonate 1500 (600 Ca) MG Tabs tablet Commonly known as: OSCAL Take 600 mg of elemental calcium by mouth daily with breakfast.   escitalopram 10 MG tablet Commonly known as: LEXAPRO Take 5 mg by mouth daily.   fluticasone  50 MCG/ACT nasal spray Commonly known as: FLONASE  1-2 sprays each nostril 1-7 times per week   levocetirizine 5 MG tablet Commonly known as: XYZAL  Take 2.5-5 mg by mouth daily.   meloxicam  15 MG tablet Commonly known as: MOBIC Take 15 mg by mouth daily.   montelukast  10 MG tablet Commonly known as: SINGULAIR  Take 1 tablet (10 mg total) by mouth daily.   multivitamin with minerals tablet Take 1 tablet by mouth daily.   mupirocin  ointment 2 % Commonly known as: BACTROBAN  Apply inside both nostrils 3 times a day for 10 days. What changed: additional instructions Changed by: Michelle Miles J Michelle Miles   pantoprazole  40 MG tablet Commonly known as: PROTONIX  TAKE 1 TABLET 1-2 TIMES PER DAY   potassium chloride  SA 20 MEQ tablet Commonly known as: KLOR-CON   M Take 20 mEq by mouth 2 (two) times daily.   triamterene -hydrochlorothiazide 37.5-25 MG tablet Commonly known as: MAXZIDE-25 Take 1 each (1 tablet total) by mouth daily.   VITAMIN C PO Take by mouth daily.   VITAMIN D3 PO Take by mouth daily.    Past Medical History:  Diagnosis Date   Asthma    Chronic bronchitis (HCC)    Chronic sinus infection    used to get them all the time; recently had one after none in 3-4 years (02/29/2016)   Family history of adverse reaction to anesthesia    daughter gets PONV & is hard to wake up   GERD (gastroesophageal reflux disease)    Hypokalemia    IBS (irritable bowel syndrome)    Meniere's disease    trated with maxide   PONV (postoperative nausea and vomiting)    also slow to wake   Recurrent upper respiratory infection (URI)    SVT (supraventricular tachycardia) (HCC)     Past Surgical History:  Procedure Laterality Date   CARPAL TUNNEL RELEASE Right ~ 2008   CARPAL TUNNEL RELEASE Left 02/2020   CHOLECYSTECTOMY     COLONOSCOPY N/A 02/03/2013   Procedure: COLONOSCOPY;  Surgeon: Michelle JAYSON Hint, MD;  Location[facility] ENDOSCOPY;  Service: Endoscopy;  Laterality: N/A;   DILATION AND CURETTAGE OF UTERUS  1998   1 wk after I had my son   ELECTROPHYSIOLOGIC STUDY N/A 02/29/2016   Procedure: SVT Ablation;  Surgeon: Michelle Michelle Norton, MD;  Location: [facility] INVASIVE CV LAB;  Service: Cardiovascular;  Laterality: N/A;   ENDOMETRIAL ABLATION     HEMORRHOID SURGERY N/A 02/19/2013   Procedure: PROCEDURE PROLAPSED HEMORRHOIDS;  Surgeon: Michelle ONEIDA Olives, MD;  Location: [facility] ORS;  Service: General;  Laterality: N/A;   LAPAROSCOPIC CHOLECYSTECTOMY  ~ 2007   NASAL SEPTUM SURGERY  ~ 2010   PLANTAR FASCIA RELEASE Right 2000s   right   SINOSCOPY     SUPRAVENTRICULAR TACHYCARDIA ABLATION  02/29/2016   TENDON REPAIR  02/2020    Review of systems negative except as noted in HPI / PMHx or noted below:  Review of Systems  Constitutional: Negative.    HENT: Negative.    Eyes: Negative.   Respiratory: Negative.    Cardiovascular: Negative.   Gastrointestinal: Negative.   Genitourinary: Negative.   Musculoskeletal: Negative.   Skin: Negative.   Neurological: Negative.   Endo/Heme/Allergies: Negative.   Psychiatric/Behavioral: Negative.       Objective:   Vitals:   01/30/24 1521  BP: 122/80  Pulse: 88  Resp: 14  SpO2: 94%   Height: 5' 3.3 (160.8 cm)  Weight: 236 lb 9.6 oz (107.3 kg)   Physical Exam Constitutional:      Appearance: She is not diaphoretic.  HENT:     Head: Normocephalic.     Right Ear: Tympanic membrane, ear canal and external ear normal.  Left Ear: Tympanic membrane, ear canal and external ear normal.     Nose: Mucosal edema (crusty bloody septal mucosa with ulcer right) present. No rhinorrhea.     Mouth/Throat:     Pharynx: Uvula midline. No oropharyngeal exudate.  Eyes:     Conjunctiva/sclera: Conjunctivae normal.  Neck:     Thyroid : No thyromegaly.     Trachea: Trachea normal. No tracheal tenderness or tracheal deviation.  Cardiovascular:     Rate and Rhythm: Normal rate and regular rhythm.     Heart sounds: Normal heart sounds, S1 normal and S2 normal. No murmur heard. Pulmonary:     Effort: No respiratory distress.     Breath sounds: Normal breath sounds. No stridor. No wheezing or rales.  Lymphadenopathy:     Head:     Right side of head: No tonsillar adenopathy.     Left side of head: No tonsillar adenopathy.     Cervical: No cervical adenopathy.  Skin:    Findings: No erythema or rash.     Nails: There is no clubbing.  Neurological:     Mental Status: She is alert.     Diagnostics: Spirometry was performed and demonstrated an FEV1 of 2.40 at 92 % of predicted.   Assessment and Plan:   1. Asthma, moderate persistent, well-controlled   2. Other allergic rhinitis   3. LPRD (laryngopharyngeal reflux disease)   4. Nasal septal ulcer    1.  No more antibiotics or  steroids  2. Continue to Treat and prevent inflammation:   A.  Symbicort  80 - 2 inhalations 1-2 times a day    B.  Montelukast  10 mg - 1 tablet 1 time per day  C.  Flonase  - 1-2 sprays each nostril 1-7 times per week  3.  Continue to Treat and prevent reflux:   A.  Protonix  40 mg 1-2 times per day  4. If needed:   A.  Levocetirizine 5 mg -1 tablet once a day  B.  AIRSUPRA  -  2 puffs every 4-6 hours (coupon)  C.  Nasal saline spray  D.  Pataday - 1 drop each eye 1 time per day  5. For this recent event:   A. Nasal saline followed by Bactroban  - 3 times per day for 10 days  B. Increase Levocetirizine - 1-2 tablets 1-2 times per day (MAX=4/day)  C. Blood - area 2 aeroallergen profile, IgA/G/M, cbc w/d  6. Return to clinic in 4 weeks or earlier if problem    Gari has a local infection of her nasal cavity along with a septal ulceration then we are going to treat topically with Bactroban  and we are going to further evaluate if she does indeed have a allergic trigger giving rise to her upper airway dysfunction by checking the blood test noted above and also have her use a higher dose of her levo cetirizine to address any allergic trigger that may be occurring recently.  She has seen enough antibiotics and systemic steroids this fall and winter.  Should we not be able to get her airway issue under good control we Michelle obtain a CT scan of her sinuses as a roadmap concerning further treatment.  Camellia Denis, MD Allergy  / Immunology [facility]

## 2024-01-31 ENCOUNTER — Encounter: Payer: Self-pay | Admitting: Allergy and Immunology

## 2024-02-02 LAB — ALLERGENS W/TOTAL IGE AREA 2

## 2024-02-02 LAB — CBC WITH DIFFERENTIAL/PLATELET
Basophils Absolute: 0.1 10*3/uL (ref 0.0–0.2)
Basos: 1 %
EOS (ABSOLUTE): 0.1 10*3/uL (ref 0.0–0.4)
Eos: 2 %
Hematocrit: 43.8 % (ref 34.0–46.6)
Hemoglobin: 14.4 g/dL (ref 11.1–15.9)
Immature Grans (Abs): 0.1 10*3/uL (ref 0.0–0.1)
Immature Granulocytes: 1 %
Lymphocytes Absolute: 3.3 10*3/uL — ABNORMAL HIGH (ref 0.7–3.1)
Lymphs: 35 %
MCH: 31.1 pg (ref 26.6–33.0)
MCHC: 32.9 g/dL (ref 31.5–35.7)
MCV: 95 fL (ref 79–97)
Monocytes Absolute: 0.7 10*3/uL (ref 0.1–0.9)
Monocytes: 8 %
Neutrophils Absolute: 5.3 10*3/uL (ref 1.4–7.0)
Neutrophils: 53 %
Platelets: 349 10*3/uL (ref 150–450)
RBC: 4.63 x10E6/uL (ref 3.77–5.28)
RDW: 12.2 % (ref 11.7–15.4)
WBC: 9.6 10*3/uL (ref 3.4–10.8)

## 2024-02-02 LAB — IGG, IGA, IGM
IgA/Immunoglobulin A, Serum: 438 mg/dL — ABNORMAL HIGH (ref 87–352)
IgG (Immunoglobin G), Serum: 1159 mg/dL (ref 586–1602)
IgM (Immunoglobulin M), Srm: 131 mg/dL (ref 26–217)

## 2024-02-28 ENCOUNTER — Encounter: Payer: Self-pay | Admitting: Allergy and Immunology

## 2024-02-28 ENCOUNTER — Ambulatory Visit: Payer: 59 | Admitting: Allergy and Immunology

## 2024-02-28 VITALS — BP 122/88 | HR 78 | Resp 18

## 2024-02-28 DIAGNOSIS — J3089 Other allergic rhinitis: Secondary | ICD-10-CM | POA: Diagnosis not present

## 2024-02-28 DIAGNOSIS — J340 Abscess, furuncle and carbuncle of nose: Secondary | ICD-10-CM | POA: Diagnosis not present

## 2024-02-28 DIAGNOSIS — K219 Gastro-esophageal reflux disease without esophagitis: Secondary | ICD-10-CM

## 2024-02-28 DIAGNOSIS — J4540 Moderate persistent asthma, uncomplicated: Secondary | ICD-10-CM | POA: Diagnosis not present

## 2024-02-28 NOTE — Patient Instructions (Addendum)
  1. Continue to Treat and prevent inflammation:   A.  Symbicort 80 - 2 inhalations 1-2 times a day    B.  Montelukast 10 mg - 1 tablet 1 time per day  C.  Flonase - 1-2 sprays each nostril 1-7 times per week (none right nostril)  2.  Continue to Treat and prevent reflux:   A.  Protonix 40 mg 1-2 times per day  3. If needed:   A.  Levocetirizine 5 mg -1-2 tablets daily  B.  AIRSUPRA -  2 puffs every 4-6 hours   C.  Nasal saline spray  D.  Pataday - 1 drop each eye 1 time per day  5. Return to clinic in 12 weeks or earlier if problem    6. Influenza = Tamiflu.  COVID = Paxlovid

## 2024-02-28 NOTE — Progress Notes (Signed)
 [place] [facility] [place] [facility] [place] - [place] [facility] [place]   Follow-up Note  Referring Provider: Marylen Ponto, MD Primary Provider: Marylen Ponto, MD Date of Office Visit: 02/28/2024  Subjective:   Michelle Miles (DOB: [birth year]) is a 53 y.o. female who [facility] in re-evaluation of the following:  HPI: Michelle Miles returns to this clinic in evaluation of asthma, allergic rhinitis, LPR, nasal septal ulceration.  Her airway is doing pretty well at this point in time.  She feels as though she does not have as much facial pain or nasal congestion and she does not have any ugly nasal discharge.  She still has some occasional blood from her right nostril.  She has been using her nasal steroid every day.  She has been using her Symbicort only 1 time per day.  She believes that her reflux is under good control while using Protonix currently just 1 time per day.  She did finish her nasal Bactroban.  Allergies as of 02/28/2024       Reactions   Azithromycin Palpitations, Other (See Comments)   Caffeine Other (See Comments)   Makes pt light headed   Codeine Nausea And Vomiting   Diphenhydramine Other (See Comments)   Tramadol Other (See Comments)   Floxin [ofloxacin] Other (See Comments)   Causes hallucinations   Latex Rash   Prednisone Rash   Oral only        Medication List    Airsupra 90-80 MCG/ACT Aero Generic drug: Albuterol-Budesonide Inhale 2 puffs into the lungs as needed (every 4 to 6 hours for cough, wheeze, shortness of breath.  Rinse, gargle, and spit after use).   budesonide-formoterol 80-4.5 MCG/ACT inhaler Commonly known as: Symbicort Inhale two puffs twice daily to prevent cough or wheeze.  Rinse, gargle, and spit after use.   calcium carbonate 1500 (600 Ca) MG Tabs tablet Commonly known as: OSCAL Take 600 mg of elemental calcium by mouth daily with breakfast.   escitalopram 10 MG tablet Commonly known as:  LEXAPRO Take 5 mg by mouth daily.   fluticasone 50 MCG/ACT nasal spray Commonly known as: FLONASE 1-2 sprays each nostril 1-7 times per week   levocetirizine 5 MG tablet Commonly known as: XYZAL Take 2.5-5 mg by mouth daily.   meloxicam 15 MG tablet Commonly known as: MOBIC Take 15 mg by mouth daily.   montelukast 10 MG tablet Commonly known as: SINGULAIR Take 1 tablet (10 mg total) by mouth daily.   multivitamin with minerals tablet Take 1 tablet by mouth daily.   mupirocin ointment 2 % Commonly known as: BACTROBAN Apply inside both nostrils 3 times a day for 10 days.   pantoprazole 40 MG tablet Commonly known as: PROTONIX TAKE 1 TABLET 1-2 TIMES PER DAY   potassium chloride SA 20 MEQ tablet Commonly known as: KLOR-CON M Take 20 mEq by mouth 2 (two) times daily.   triamterene-hydrochlorothiazide 37.5-25 MG tablet Commonly known as: MAXZIDE-25 Take 1 each (1 tablet total) by mouth daily.   VITAMIN C PO Take by mouth daily.   VITAMIN D3 PO Take by mouth daily.    Past Medical History:  Diagnosis Date   Asthma    Chronic bronchitis (HCC)    Chronic sinus infection    "used to get them all the time; recently had one after none in 3-4 years" (02/29/2016)   Family history of adverse reaction to anesthesia    "daughter gets PONV & is  hard to wake up"   GERD (gastroesophageal reflux disease)    Hypokalemia    IBS (irritable bowel syndrome)    Meniere's disease    trated with maxide   PONV (postoperative nausea and vomiting)    also "slow to wake"   Recurrent upper respiratory infection (URI)    SVT (supraventricular tachycardia) (HCC)     Past Surgical History:  Procedure Laterality Date   CARPAL TUNNEL RELEASE Right ~ 2008   CARPAL TUNNEL RELEASE Left 02/2020   CHOLECYSTECTOMY     COLONOSCOPY N/A 02/03/2013   Procedure: COLONOSCOPY;  Surgeon: Barrie Folk, MD;  Location: [facility] ENDOSCOPY;  Service: Endoscopy;  Laterality: N/A;   DILATION AND CURETTAGE OF  UTERUS  1998   "1 wk after I had my son"   ELECTROPHYSIOLOGIC STUDY N/A 02/29/2016   Procedure: SVT Ablation;  Surgeon: Will Jorja Loa, MD;  Location: [facility] INVASIVE CV LAB;  Service: Cardiovascular;  Laterality: N/A;   ENDOMETRIAL ABLATION     HEMORRHOID SURGERY N/A 02/19/2013   Procedure: PROCEDURE PROLAPSED HEMORRHOIDS;  Surgeon: Mariella Saa, MD;  Location: [facility] ORS;  Service: General;  Laterality: N/A;   LAPAROSCOPIC CHOLECYSTECTOMY  ~ 2007   NASAL SEPTUM SURGERY  ~ 2010   PLANTAR FASCIA RELEASE Right 2000s   right   SINOSCOPY     SUPRAVENTRICULAR TACHYCARDIA ABLATION  02/29/2016   TENDON REPAIR  02/2020    Review of systems negative except as noted in HPI / PMHx or noted below:  Review of Systems  Constitutional: Negative.   HENT: Negative.    Eyes: Negative.   Respiratory: Negative.    Cardiovascular: Negative.   Gastrointestinal: Negative.   Genitourinary: Negative.   Musculoskeletal: Negative.   Skin: Negative.   Neurological: Negative.   Endo/Heme/Allergies: Negative.   Psychiatric/Behavioral: Negative.       Objective:   Vitals:   02/28/24 1630  BP: 122/88  Pulse: 78  Resp: 18  SpO2: 95%          Physical Exam Constitutional:      Appearance: She is not diaphoretic.  HENT:     Head: Normocephalic.     Right Ear: Tympanic membrane, ear canal and external ear normal.     Left Ear: Tympanic membrane, ear canal and external ear normal.     Nose: Mucosal edema (right septal erythema with minimal punctate bleeding sites) present. No rhinorrhea.     Mouth/Throat:     Pharynx: Uvula midline. No oropharyngeal exudate.  Eyes:     Conjunctiva/sclera: Conjunctivae normal.  Neck:     Thyroid: No thyromegaly.     Trachea: Trachea normal. No tracheal tenderness or tracheal deviation.  Cardiovascular:     Rate and Rhythm: Normal rate and regular rhythm.     Heart sounds: Normal heart sounds, S1 normal and S2 normal. No murmur heard. Pulmonary:     Effort:  No respiratory distress.     Breath sounds: Normal breath sounds. No stridor. No wheezing or rales.  Lymphadenopathy:     Head:     Right side of head: No tonsillar adenopathy.     Left side of head: No tonsillar adenopathy.     Cervical: No cervical adenopathy.  Skin:    Findings: No erythema or rash.     Nails: There is no clubbing.  Neurological:     Mental Status: She is alert.     Diagnostics: Spirometry was performed and demonstrated an FEV1 of 2.26 at 86 % of  predicted.   Results of blood tests obtained 30 January 2024 identified IgG 1159 Mg/DL, IgA 960 Mg/DL, IgM 454 Mg/DL, IgE 098 Mg/DL, no antigen specific IgE antibodies on an area two aero allergen profile, WBC 9.6, absolute eosinophil 100, absolute lymphocyte 3300, hemoglobin 14.4, platelet 349.  Assessment and Plan:   1. Asthma, moderate persistent, well-controlled   2. Other allergic rhinitis   3. LPRD (laryngopharyngeal reflux disease)   4. Nasal septal ulcer    1. Continue to Treat and prevent inflammation:   A.  Symbicort 80 - 2 inhalations 1-2 times a day    B.  Montelukast 10 mg - 1 tablet 1 time per day  C.  Flonase - 1-2 sprays each nostril 1-7 times per week (none right nostril)  2.  Continue to Treat and prevent reflux:   A.  Protonix 40 mg 1-2 times per day  3. If needed:   A.  Levocetirizine 5 mg -1-2 tablets daily  B.  AIRSUPRA -  2 puffs every 4-6 hours   C.  Nasal saline spray  D.  Pataday - 1 drop each eye 1 time per day  5. Return to clinic in 12 weeks or earlier if problem    6. Influenza = Tamiflu.  COVID = Paxlovid  Maziah appears to be doing better on her current plan and she will remain on a collection of anti-inflammatory agents and treatment directed against reflux as noted above.  Her nasal septal ulceration looks much better but it is not completely healed and I asked her not to use any nasal fluticasone in her right nostril at this point.  I will see her back in this clinic in  12 weeks or earlier should there be a problem.  Laurette Schimke, MD Allergy / Immunology [facility]

## 2024-03-03 ENCOUNTER — Encounter: Payer: Self-pay | Admitting: Allergy and Immunology

## 2024-04-01 ENCOUNTER — Telehealth: Payer: Self-pay | Admitting: Allergy and Immunology

## 2024-04-01 DIAGNOSIS — K219 Gastro-esophageal reflux disease without esophagitis: Secondary | ICD-10-CM

## 2024-04-01 MED ORDER — PANTOPRAZOLE SODIUM 40 MG PO TBEC: DELAYED_RELEASE_TABLET | ORAL | 3 refills | Status: AC

## 2024-04-01 MED ORDER — LEVOCETIRIZINE DIHYDROCHLORIDE 5 MG PO TABS: 5.0000 mg | ORAL_TABLET | Freq: Two times a day (BID) | ORAL | 5 refills | Status: AC

## 2024-04-01 NOTE — Telephone Encounter (Signed)
 Patient would like to know if we can send in Pantoprazole and Xyzal to [facility] in [place]. Since Dr. Lucie Leather increased her dose, she is running out sooner. She is taking both 2 times a day.

## 2024-04-01 NOTE — Telephone Encounter (Signed)
 Rx's have been sent with updated quantity.

## 2024-05-29 ENCOUNTER — Ambulatory Visit: Admitting: Allergy and Immunology

## 2024-06-27 ENCOUNTER — Other Ambulatory Visit: Payer: Self-pay | Admitting: Allergy and Immunology

## 2024-09-25 ENCOUNTER — Other Ambulatory Visit: Payer: Self-pay | Admitting: Allergy and Immunology

## 2024-09-25 DIAGNOSIS — J3089 Other allergic rhinitis: Secondary | ICD-10-CM

## 2024-12-31 ENCOUNTER — Other Ambulatory Visit: Payer: Self-pay | Admitting: Allergy and Immunology

## 2024-12-31 DIAGNOSIS — J3089 Other allergic rhinitis: Secondary | ICD-10-CM
